# Patient Record
Sex: Male | Born: 1970 | Race: Asian | Hispanic: No | Marital: Married | Smoking: Never smoker
Health system: Southern US, Community
[De-identification: ages and names within clinical notes are randomized; demographics above are authoritative.]

## PROBLEM LIST (undated history)

## (undated) DIAGNOSIS — T7840XA Allergy, unspecified, initial encounter: Secondary | ICD-10-CM

## (undated) DIAGNOSIS — B1910 Unspecified viral hepatitis B without hepatic coma: Secondary | ICD-10-CM

## (undated) HISTORY — DX: Allergy, unspecified, initial encounter: T78.40XA

## (undated) HISTORY — DX: Unspecified viral hepatitis B without hepatic coma: B19.10

---

## 2019-03-07 ENCOUNTER — Emergency Department (HOSPITAL_BASED_OUTPATIENT_CLINIC_OR_DEPARTMENT_OTHER)
Admission: EM | Admit: 2019-03-07 | Discharge: 2019-03-07 | Disposition: A | Discharge status: Home / Self Care | Payer: Commercial Managed Care - PPO | Attending: Emergency Medicine | Admitting: Emergency Medicine

## 2019-03-07 ENCOUNTER — Other Ambulatory Visit: Payer: Self-pay

## 2019-03-07 ENCOUNTER — Emergency Department (HOSPITAL_BASED_OUTPATIENT_CLINIC_OR_DEPARTMENT_OTHER): Payer: Commercial Managed Care - PPO

## 2019-03-07 ENCOUNTER — Encounter (HOSPITAL_BASED_OUTPATIENT_CLINIC_OR_DEPARTMENT_OTHER): Payer: Self-pay

## 2019-03-07 DIAGNOSIS — R42 Dizziness and giddiness: Secondary | ICD-10-CM | POA: Diagnosis not present

## 2019-03-07 LAB — CBC WITH DIFFERENTIAL/PLATELET
Abs Immature Granulocytes: 0.01 10*3/uL (ref 0.00–0.07)
Basophils Absolute: 0 10*3/uL (ref 0.0–0.1)
Basophils Relative: 0 %
Eosinophils Absolute: 0 10*3/uL (ref 0.0–0.5)
Eosinophils Relative: 0 %
HCT: 50 % (ref 39.0–52.0)
Hemoglobin: 16.3 g/dL (ref 13.0–17.0)
Immature Granulocytes: 0 %
Lymphocytes Relative: 31 %
Lymphs Abs: 1.1 10*3/uL (ref 0.7–4.0)
MCH: 26.5 pg (ref 26.0–34.0)
MCHC: 32.6 g/dL (ref 30.0–36.0)
MCV: 81.4 fL (ref 80.0–100.0)
Monocytes Absolute: 0.5 10*3/uL (ref 0.1–1.0)
Monocytes Relative: 13 %
Neutro Abs: 1.9 10*3/uL (ref 1.7–7.7)
Neutrophils Relative %: 56 %
Platelets: 170 10*3/uL (ref 150–400)
RBC: 6.14 MIL/uL — ABNORMAL HIGH (ref 4.22–5.81)
RDW: 12.8 % (ref 11.5–15.5)
WBC: 3.5 10*3/uL — ABNORMAL LOW (ref 4.0–10.5)
nRBC: 0 % (ref 0.0–0.2)

## 2019-03-07 LAB — BASIC METABOLIC PANEL
Anion gap: 8 (ref 5–15)
BUN: 20 mg/dL (ref 6–20)
CO2: 22 mmol/L (ref 22–32)
Calcium: 8.3 mg/dL — ABNORMAL LOW (ref 8.9–10.3)
Chloride: 108 mmol/L (ref 98–111)
Creatinine, Ser: 0.95 mg/dL (ref 0.61–1.24)
GFR calc Af Amer: >60 mL/min (ref >60)
GFR calc non Af Amer: >60 mL/min (ref >60)
Glucose, Bld: 118 mg/dL — ABNORMAL HIGH (ref 70–99)
Potassium: 3.8 mmol/L (ref 3.5–5.1)
Sodium: 138 mmol/L (ref 135–145)

## 2019-03-07 LAB — CBG MONITORING, ED: Glucose-Capillary: 123 mg/dL — ABNORMAL HIGH (ref 70–99)

## 2019-03-07 MED ORDER — SODIUM CHLORIDE 0.9 % IV BOLUS
500.0000 mL | Freq: Once | INTRAVENOUS | Status: AC
  Administered 2019-03-07: 500 mL via INTRAVENOUS

## 2019-03-07 NOTE — Discharge Instructions (Signed)
Make sure to drink plenty of water, at least 64 ounces daily.  Please follow-up and establish care with a primary care provider by calling the number circled on your discharge paperwork.  Please return to the emergency department if you develop any new or worsening symptoms including completely passing out, not able to walk in a straight line without falling, persistent dizziness, or any other new or concerning symptoms.

## 2019-03-07 NOTE — ED Notes (Signed)
Pt laying flat for orthostatics  

## 2019-03-07 NOTE — ED Triage Notes (Signed)
Pt presents with HA x 1 week with nausea. Denies dizziness/blurred vision. Pt took 2 tylenol at 8am.

## 2019-03-07 NOTE — ED Notes (Signed)
ED Provider at bedside. 

## 2019-03-07 NOTE — ED Provider Notes (Signed)
[facility] EMERGENCY DEPARTMENT Provider Note   CSN: [id number] Arrival date & time: 03/07/19  1408    History   Chief Complaint Chief Complaint  Patient presents with   Dizziness    HPI Cesar Reyes is a 48 y.o. male who presents with a one-week history of feeling off balance when he wakes up.  He has had some associated nausea, but no vomiting.  He denies any headache, vision changes.  He describes his symptoms like feeling unsteady when he walks in the morning, however gets better over the course of the day.  He reports he has felt cold this week, however denies any documented fever.  He has had a decreased appetite.  He does not know if he has lost any weight.  He denies any chest pain, shortness of breath, abdominal pain, vomiting, diarrhea, urinary symptoms.  Patient has taken Tylenol a few times this week which is helped his symptoms of feeling cold.     The history is limited by a language barrier. A language interpreter was used (patient's daughter filled in gaps, as patient does speak english).  Dizziness  Quality:  Imbalance Associated symptoms: nausea   Associated symptoms: no chest pain, no diarrhea, no headaches, no shortness of breath and no vomiting     History reviewed. No pertinent past medical history.  There are no active problems to display for this patient.   History reviewed. No pertinent surgical history.      Home Medications    Prior to Admission medications   Not on File    Family History No family history on file.  Social History Social History   Tobacco Use   Smoking status: Never Smoker   Smokeless tobacco: Never Used  Substance Use Topics   Alcohol use: Never    Frequency: Never   Drug use: Never     Allergies   Patient has no allergy information on record.   Review of Systems Review of Systems  Constitutional: Positive for appetite change and chills. Negative for fever.  HENT: Negative for facial swelling  and sore throat.   Respiratory: Negative for shortness of breath.   Cardiovascular: Negative for chest pain.  Gastrointestinal: Positive for nausea. Negative for abdominal pain, diarrhea and vomiting.  Genitourinary: Negative for dysuria.  Musculoskeletal: Negative for back pain.  Skin: Negative for rash and wound.  Neurological: Positive for dizziness (feeling unsteady). Negative for seizures, syncope and headaches.  Psychiatric/Behavioral: The patient is not nervous/anxious.      Physical Exam Updated Vital Signs BP 128/86 (BP Location: Right Arm)    Pulse 78    Temp 97.7 F (36.5 C) (Oral)    Resp 17    Ht 5\' 4"  (1.626 m)    SpO2 99%   Physical Exam Vitals signs and nursing note reviewed.  Constitutional:      General: He is not in acute distress.    Appearance: He is well-developed. He is not diaphoretic.  HENT:     Head: Normocephalic and atraumatic.     Right Ear: Tympanic membrane normal. No middle ear effusion. Tympanic membrane is not erythematous.     Left Ear: Tympanic membrane normal.  No middle ear effusion. Tympanic membrane is not erythematous.     Mouth/Throat:     Pharynx: No oropharyngeal exudate.  Eyes:     General: No scleral icterus.       Right eye: No discharge.        Left eye: No discharge.  Conjunctiva/sclera: Conjunctivae normal.     Pupils: Pupils are equal, round, and reactive to light.  Neck:     Musculoskeletal: Normal range of motion and neck supple.     Thyroid: No thyromegaly.  Cardiovascular:     Rate and Rhythm: Normal rate and regular rhythm.     Heart sounds: Normal heart sounds. No murmur. No friction rub. No gallop.   Pulmonary:     Effort: Pulmonary effort is normal. No respiratory distress.     Breath sounds: Normal breath sounds. No stridor. No wheezing or rales.  Abdominal:     General: Bowel sounds are normal. There is no distension.     Palpations: Abdomen is soft.     Tenderness: There is no abdominal tenderness. There is  no guarding or rebound.  Lymphadenopathy:     Cervical: No cervical adenopathy.  Skin:    General: Skin is warm and dry.     Coloration: Skin is not pale.     Findings: No rash.  Neurological:     Mental Status: He is alert.     Coordination: Coordination normal.     Comments: CN 3-12 intact; normal sensation throughout; 5/5 strength in all 4 extremities; equal bilateral grip strength; no ataxia on finger-to-nose      ED Treatments / Results  Labs (all labs ordered are listed, but only abnormal results are displayed) Labs Reviewed  BASIC METABOLIC PANEL - Abnormal; Notable for the following components:      Result Value   Glucose, Bld 118 (*)    Calcium 8.3 (*)    All other components within normal limits  CBC WITH DIFFERENTIAL/PLATELET - Abnormal; Notable for the following components:   WBC 3.5 (*)    RBC 6.14 (*)    All other components within normal limits  CBG MONITORING, ED - Abnormal; Notable for the following components:   Glucose-Capillary 123 (*)    All other components within normal limits    EKG EKG Interpretation  Date/Time:  Friday Mar 07 2019 14:39:24 EDT Ventricular Rate:  76 PR Interval:    QRS Duration: 97 QT Interval:  383 QTC Calculation: 431 R Axis:   42 Text Interpretation:  Sinus rhythm Abnormal R-wave progression, early transition Slight PR depression Confirmed by Alvira Monday ([id number]) on 03/07/2019 2:57:10 PM   Radiology Ct Head Wo Contrast  Result Date: 03/07/2019 CLINICAL DATA:  Dizziness, lightheadedness, headache EXAM: CT HEAD WITHOUT CONTRAST TECHNIQUE: Contiguous axial images were obtained from the base of the skull through the vertex without intravenous contrast. COMPARISON:  None. FINDINGS: Brain: No evidence of acute infarction, hemorrhage, hydrocephalus, extra-axial collection or mass lesion/mass effect. Vascular: No hyperdense vessel or unexpected calcification. Skull: Normal. Negative for fracture or focal lesion. Sinuses/Orbits: No  acute finding. Other: None. IMPRESSION: No acute intracranial pathology. No non-contrast CT findings to explain dizziness, lightheadedness, or headache. Electronically Signed   By: Lauralyn Primes M.D.   On: 03/07/2019 15:34    Procedures Procedures (including critical care time)  Medications Ordered in ED Medications  sodium chloride 0.9 % bolus 500 mL (0 mLs Intravenous Stopped 03/07/19 1514)     Initial Impression / Assessment and Plan / ED Course  I have reviewed the triage vital signs and the nursing notes.  Pertinent labs & imaging results that were available during my care of the patient were reviewed by me and considered in my medical decision making (see chart for details).        Patient presenting with  a one-week history of lightheadedness, mostly in the morning.  He is also had some chills and decreased appetite.  Patient was mildly orthostatic with drop from 130/90 to 120/90.  His labs are unremarkable except for some mild leukopenia with WBC at 3.5.  CT head is negative for acute findings.  Patient walks with a normal gait.  He is given 500 mL fluid bolus and is feeling much improved.  We walked around the department without any dizziness or lightheadedness.  Patient advised to increase the amount of water he is drinking daily.  Also consider of a viral syndrome, however patient has no other associated respiratory or GI symptoms.  Follow-up and establish care with a PCP.  Patient given resources.  Patient understands reasons to return.  Patient vitals stable throughout ED course and discharged in satisfactory condition. I discussed patient case with Dr. Dalene Seltzer who guided the patient's management and agrees with plan.   Final Clinical Impressions(s) / ED Diagnoses   Final diagnoses:  Lightheadedness    ED Discharge Orders    None       Emi Holes, PA-C 03/07/19 1548    Alvira Monday, MD 03/08/19 [phone]

## 2019-03-07 NOTE — ED Notes (Signed)
Patient transported to CT 

## 2020-10-29 IMAGING — CT CT HEAD WITHOUT CONTRAST
3 series · 16 of 47 positions shown, 19 images · non-contrast
Comparison: None.

CLINICAL DATA: Dizziness, lightheadedness, headache

EXAM:
CT HEAD WITHOUT CONTRAST
TECHNIQUE: Contiguous axial images were obtained from the base of the skull
through the vertex without intravenous contrast.

[Series 2: head wo · axial · 0.45mm/px · z∈[-148,-18]mm · 10 of 32 slices shown, 13 images]
[im 3/32  brain]
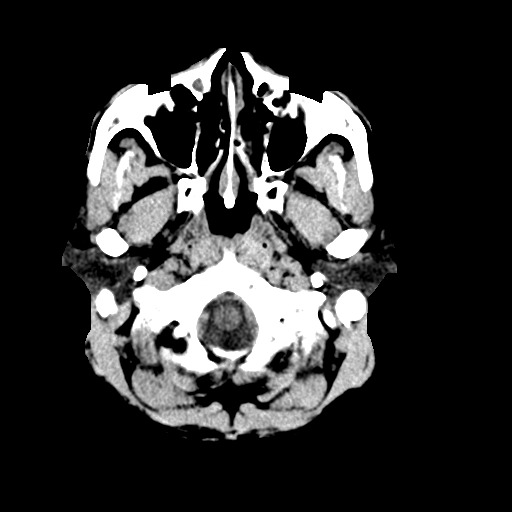
[im 3/32  bone]
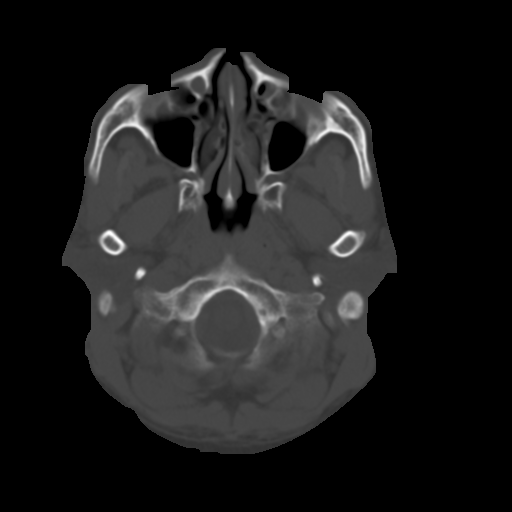
[im 6/32  brain]
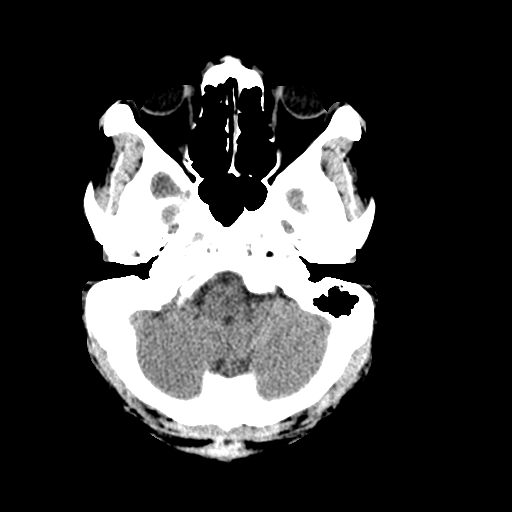
[im 9/32  brain]
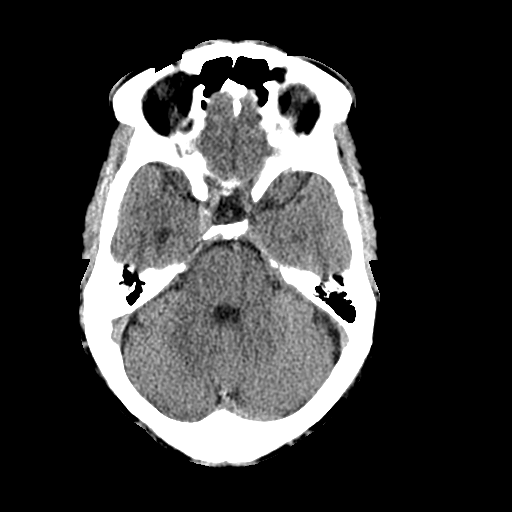
[im 11/32  brain]
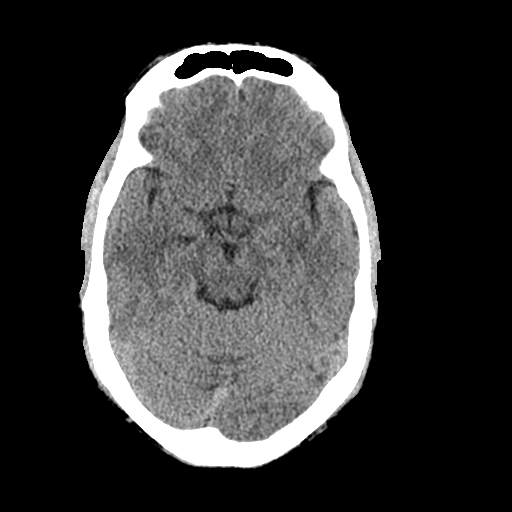
[im 14/32  brain]
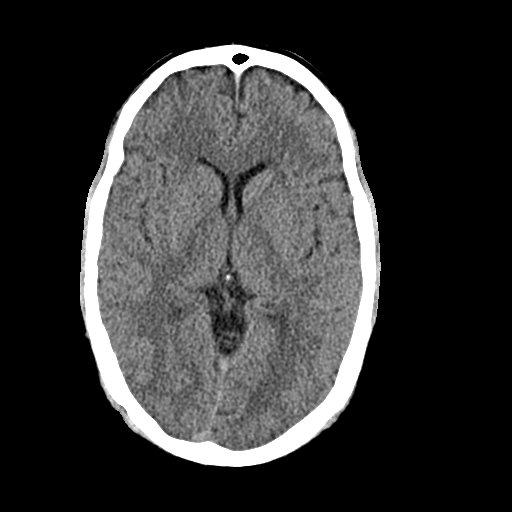
[im 14/32  bone]
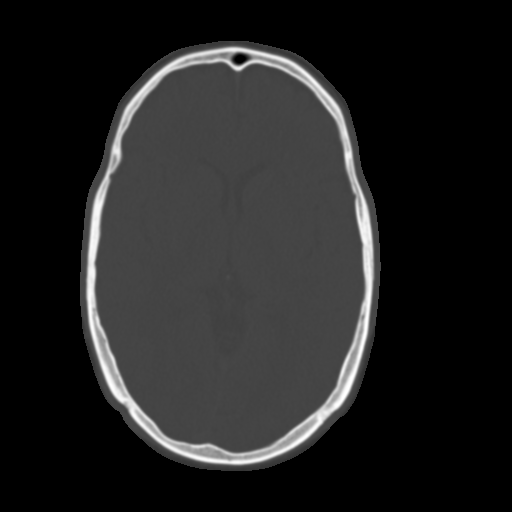
[im 18/32  brain]
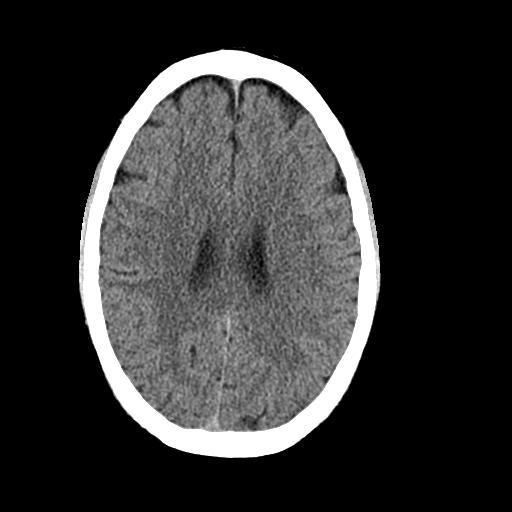
[im 21/32  brain]
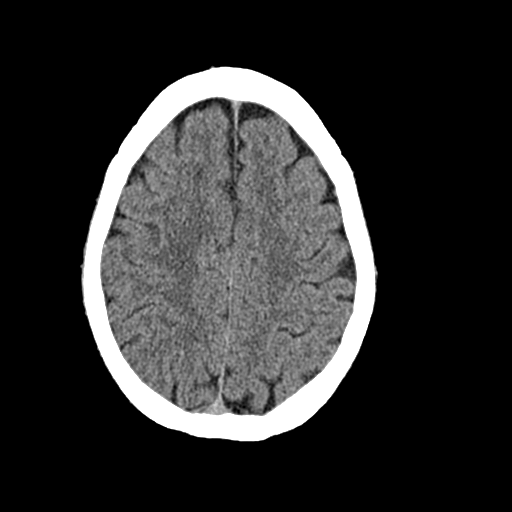
[im 24/32  brain]
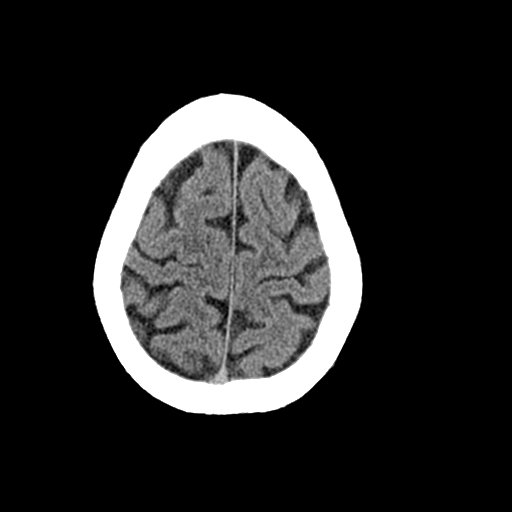
[im 26/32  brain]
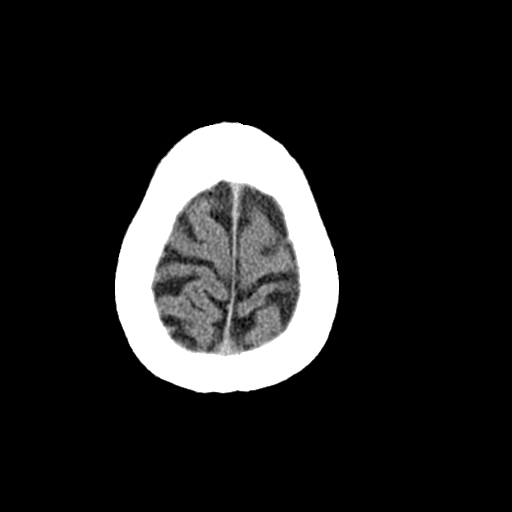
[im 26/32  bone]
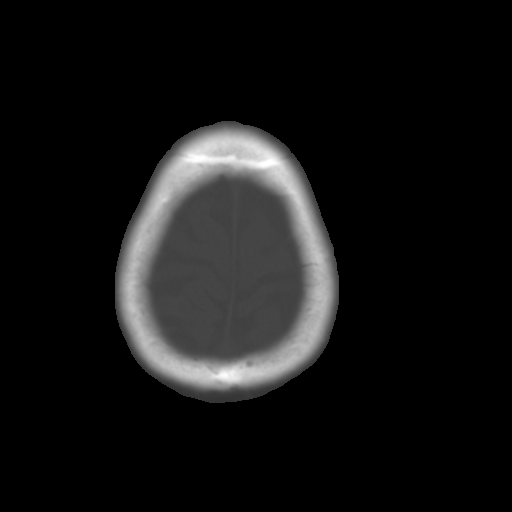
[im 29/32  brain]
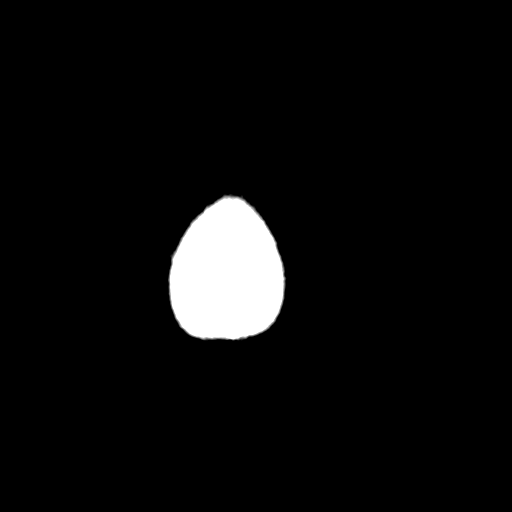

[Series 4: cor soft · coronal · 0.33mm/px · 3 of 71 slices shown]
[im 24/71  brain]
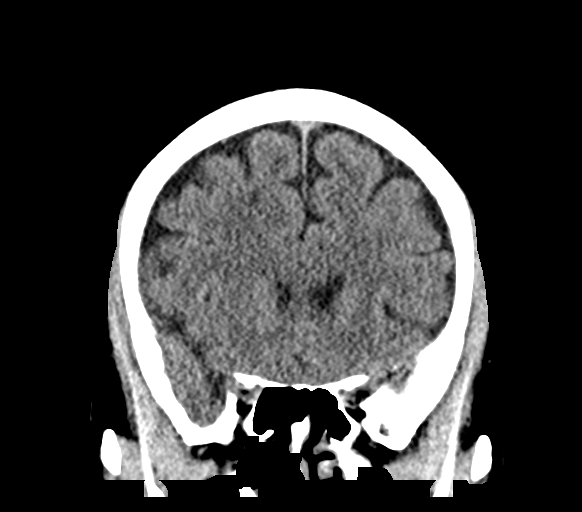
[im 32/71  brain]
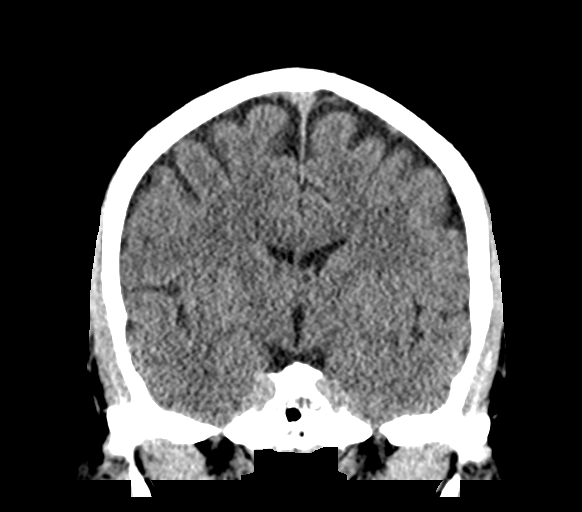
[im 39/71  brain]
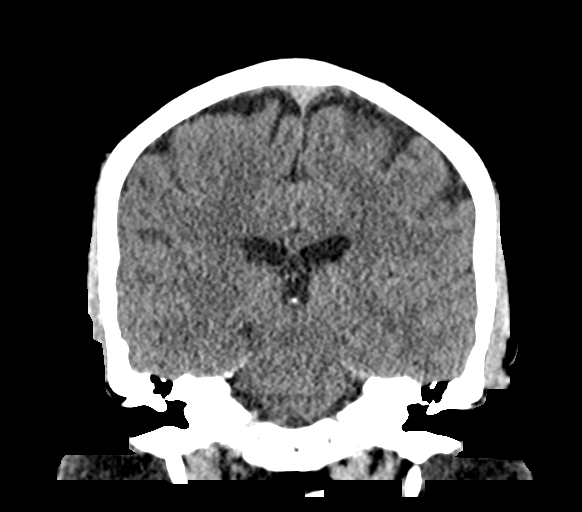

[Series 5: sag soft · sagittal · 0.33mm/px · 3 of 58 slices shown]
[im 20/58  brain]
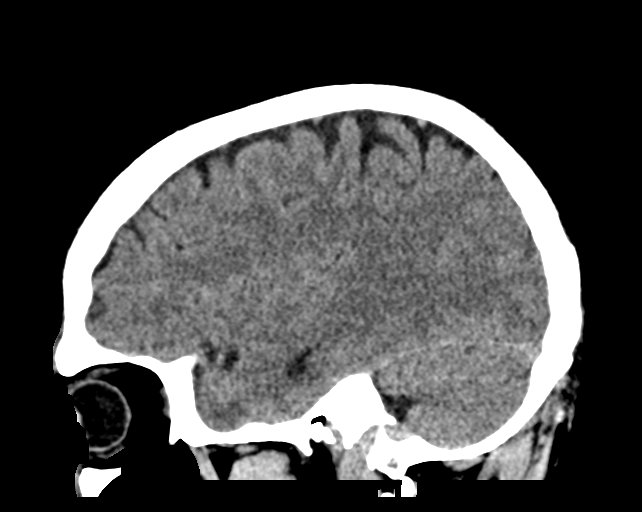
[im 29/58  brain]
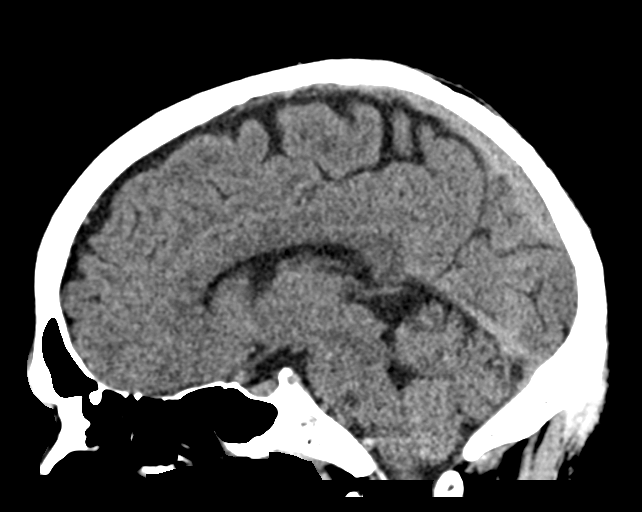
[im 39/58  brain]
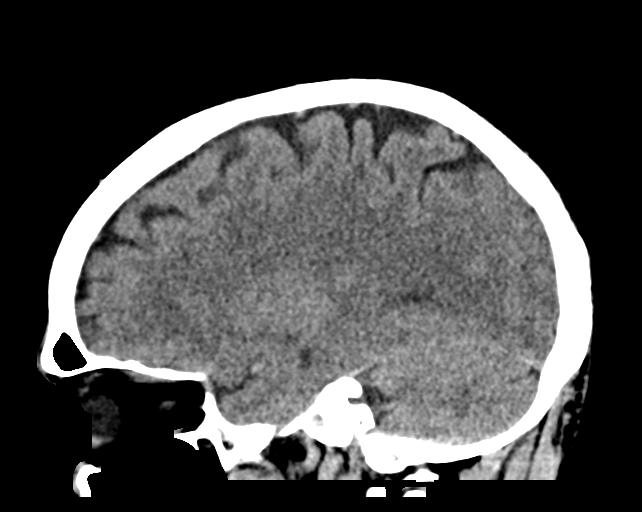

[16 of 47 positions shown; findings below may reference images not displayed]

FINDINGS: Brain: No evidence of acute infarction, hemorrhage, hydrocephalus,
extra-axial collection or mass lesion/mass effect.

Vascular: No hyperdense vessel or unexpected calcification.

Skull: Normal. Negative for fracture or focal lesion.

Sinuses/Orbits: No acute finding.

Other: None.
IMPRESSION: No acute intracranial pathology. No non-contrast CT findings to
explain dizziness, lightheadedness, or headache.

## 2023-07-23 ENCOUNTER — Emergency Department (HOSPITAL_BASED_OUTPATIENT_CLINIC_OR_DEPARTMENT_OTHER): Payer: PRIVATE HEALTH INSURANCE

## 2023-07-23 ENCOUNTER — Inpatient Hospital Stay (HOSPITAL_BASED_OUTPATIENT_CLINIC_OR_DEPARTMENT_OTHER)
Admission: EM | Admit: 2023-07-23 | Discharge: 2023-07-27 | DRG: 442 | Disposition: A | Discharge status: Home / Self Care | Payer: PRIVATE HEALTH INSURANCE | Attending: Internal Medicine | Admitting: Internal Medicine

## 2023-07-23 ENCOUNTER — Other Ambulatory Visit: Payer: Self-pay

## 2023-07-23 DIAGNOSIS — K7200 Acute and subacute hepatic failure without coma: Principal | ICD-10-CM | POA: Diagnosis present

## 2023-07-23 DIAGNOSIS — R109 Unspecified abdominal pain: Secondary | ICD-10-CM

## 2023-07-23 DIAGNOSIS — Z8249 Family history of ischemic heart disease and other diseases of the circulatory system: Secondary | ICD-10-CM

## 2023-07-23 DIAGNOSIS — R1084 Generalized abdominal pain: Secondary | ICD-10-CM | POA: Diagnosis present

## 2023-07-23 DIAGNOSIS — N400 Enlarged prostate without lower urinary tract symptoms: Secondary | ICD-10-CM | POA: Diagnosis present

## 2023-07-23 DIAGNOSIS — R7989 Other specified abnormal findings of blood chemistry: Secondary | ICD-10-CM

## 2023-07-23 DIAGNOSIS — R17 Unspecified jaundice: Secondary | ICD-10-CM

## 2023-07-23 DIAGNOSIS — I10 Essential (primary) hypertension: Secondary | ICD-10-CM | POA: Diagnosis present

## 2023-07-23 DIAGNOSIS — B181 Chronic viral hepatitis B without delta-agent: Secondary | ICD-10-CM | POA: Diagnosis present

## 2023-07-23 DIAGNOSIS — R7401 Elevation of levels of liver transaminase levels: Principal | ICD-10-CM

## 2023-07-23 LAB — URINALYSIS, ROUTINE W REFLEX MICROSCOPIC
Bacteria, UA: NONE SEEN
Glucose, UA: NEGATIVE mg/dL
Hgb urine dipstick: NEGATIVE
Ketones, ur: NEGATIVE mg/dL
Leukocytes,Ua: NEGATIVE
Nitrite: NEGATIVE
Protein, ur: NEGATIVE mg/dL
Specific Gravity, Urine: 1.018 (ref 1.005–1.030)
pH: 6 (ref 5.0–8.0)

## 2023-07-23 LAB — CBC
HCT: 39.4 % (ref 39.0–52.0)
Hemoglobin: 13.7 g/dL (ref 13.0–17.0)
MCH: 28.7 pg (ref 26.0–34.0)
MCHC: 34.8 g/dL (ref 30.0–36.0)
MCV: 82.6 fL (ref 80.0–100.0)
Platelets: 178 10*3/uL (ref 150–400)
RBC: 4.77 MIL/uL (ref 4.22–5.81)
RDW: 16.4 % — ABNORMAL HIGH (ref 11.5–15.5)
WBC: 4.9 10*3/uL (ref 4.0–10.5)
nRBC: 0 % (ref 0.0–0.2)

## 2023-07-23 LAB — COMPREHENSIVE METABOLIC PANEL
ALT: 1826 U/L — ABNORMAL HIGH (ref 0–44)
AST: 649 U/L — ABNORMAL HIGH (ref 15–41)
Albumin: 3.5 g/dL (ref 3.5–5.0)
Alkaline Phosphatase: 70 U/L (ref 38–126)
Anion gap: 8 (ref 5–15)
BUN: 13 mg/dL (ref 6–20)
CO2: 24 mmol/L (ref 22–32)
Calcium: 8.5 mg/dL — ABNORMAL LOW (ref 8.9–10.3)
Chloride: 105 mmol/L (ref 98–111)
Creatinine, Ser: 0.85 mg/dL (ref 0.61–1.24)
GFR, Estimated: >60 mL/min (ref >60)
Glucose, Bld: 108 mg/dL — ABNORMAL HIGH (ref 70–99)
Potassium: 3.7 mmol/L (ref 3.5–5.1)
Sodium: 137 mmol/L (ref 135–145)
Total Bilirubin: 6.3 mg/dL — ABNORMAL HIGH (ref 0.3–1.2)
Total Protein: 7.1 g/dL (ref 6.5–8.1)

## 2023-07-23 LAB — LIPASE, BLOOD: Lipase: 27 U/L (ref 11–51)

## 2023-07-23 MED ORDER — IOHEXOL 300 MG/ML  SOLN
100.0000 mL | Freq: Once | INTRAMUSCULAR | Status: AC | PRN
  Administered 2023-07-23: 80 mL via INTRAVENOUS

## 2023-07-23 NOTE — ED Provider Notes (Incomplete)
[place] EMERGENCY DEPARTMENT AT [facility] Provider Note   CSN: [id number] Arrival date & time: 07/23/23  1823     History Chief Complaint  Patient presents with  . Abdominal Pain    Cesar Reyes is a 52 y.o. male.  Patient presents to the emergency department concerns of abdominal pain.  Patient sent here from urgent care with concerns of patient's eyes and skin somewhat yellowed as well as some blood in urine.  Patient's daughter reports that he has been having symptoms for approximately 5 days or so with some abdominal pain, nausea, vomiting.  Denies any bloody stools or hematemesis.  Does endorse that stools become a little more pale recently.  Some history of alcohol use but no heavy alcohol drinking.  Patient is from [place] and daughter is unsure patient is up-to-date on his immunizations.  No recent contact with anyone with similar symptoms.  No prior history of hepatitis.  No recent weight loss, fevers, night sweats, or prior history of malignancy.   Abdominal Pain      Home Medications Prior to Admission medications   Not on File      Allergies    Patient has no known allergies.    Review of Systems   Review of Systems  Gastrointestinal:  Positive for abdominal pain.  All other systems reviewed and are negative.   Physical Exam Updated Vital Signs BP 128/88   Pulse 67   Temp 98.4 F (36.9 C) (Oral)   Resp 17   Ht 5\' 4"  (1.626 m)   Wt 78.5 kg   SpO2 98%   BMI 29.70 kg/m  Physical Exam Vitals and nursing note reviewed.  Constitutional:      General: He is not in acute distress.    Appearance: He is well-developed.  HENT:     Head: Normocephalic and atraumatic.  Eyes:     General: Scleral icterus present.     Conjunctiva/sclera: Conjunctivae normal.  Cardiovascular:     Rate and Rhythm: Normal rate and regular rhythm.     Heart sounds: No murmur heard. Pulmonary:     Effort: Pulmonary effort is normal. No respiratory distress.     Breath  sounds: Normal breath sounds.  Abdominal:     Palpations: Abdomen is soft.     Tenderness: There is abdominal tenderness in the right upper quadrant.     Comments: No obvious hepatomegaly and only mild right upper quadrant tenderness on deep palpation  Musculoskeletal:        General: No swelling.     Cervical back: Neck supple.  Skin:    General: Skin is warm and dry.     Capillary Refill: Capillary refill takes less than 2 seconds.     Comments: Skin slightly yellowed.   Neurological:     Mental Status: He is alert.  Psychiatric:        Mood and Affect: Mood normal.     ED Results / Procedures / Treatments   Labs (all labs ordered are listed, but only abnormal results are displayed) Labs Reviewed  COMPREHENSIVE METABOLIC PANEL - Abnormal; Notable for the following components:      Result Value   Glucose, Bld 108 (*)    Calcium 8.5 (*)    AST 649 (*)    ALT 1,826 (*)    Total Bilirubin 6.3 (*)    All other components within normal limits  CBC - Abnormal; Notable for the following components:   RDW 16.4 (*)  All other components within normal limits  URINALYSIS, ROUTINE W REFLEX MICROSCOPIC - Abnormal; Notable for the following components:   APPearance HAZY (*)    Bilirubin Urine SMALL (*)    All other components within normal limits  LIPASE, BLOOD  HEPATITIS PANEL, ACUTE    EKG None  Radiology No results found.  Procedures Procedures   Medications Ordered in ED Medications  iohexol (OMNIPAQUE) 300 MG/ML solution 100 mL (80 mLs Intravenous Contrast Given 07/23/23 2200)    ED Course/ Medical Decision Making/ A&P                               Medical Decision Making Amount and/or Complexity of Data Reviewed Labs: ordered. Radiology: ordered.  Risk Prescription drug management.   This patient presents to the ED for concern of abdominal pain.  Differential diagnosis includes diverticulitis, bowel obstruction, hepatitis, pancreatitis,  malignancy   Lab Tests:  I Ordered, and personally interpreted labs.  The pertinent results include: CBC unremarkable, CMP with severe elevations in AST and ALT at 649 and 1026 respectively, total bilirubin at 6.3, UA with some small bilirubin but no blood noted, lipase unremarkable, hepatitis panel pending   Imaging Studies ordered:  I ordered imaging studies including CT abdomen pelvis I independently visualized and interpreted imaging which showed *** I agree with the radiologist interpretation   Medicines ordered and prescription drug management:  I ordered medication including ***  for ***  Reevaluation of the patient after these medicines showed that the patient {resolved/improved/worsened:23923::"improved"} I have reviewed the patients home medicines and have made adjustments as needed   Problem List / ED Course:  Patient presents to the emergency department without significant past medical history here for abdominal pain.  Patient seen at urgent care and advised to come to the emergency department out of concerns of patient's yellowing skin and eyes.  Patient has had some right upper quadrant pain with nausea and vomiting for the last 5 days or so.  Denies any fevers or weakness.  No prior history of hepatitis but patient's daughter reports that they are unsure if patient is up-to-date on any hepatitis immunizations.  No recent contact with any individuals who are sick.  No IV drug use.  No heavy alcohol use. Patient's labs concerning with AST at 649, ALT at 1826, and total bilirubin at 6.3.  When compared to CMP from 03/19/2023, ALT and AST as well as bilirubin are unremarkable at that time.  Patient appears to have had a significant elevation in his levels over the last 4 months or so.  However suspect that this is likely acute as symptoms only began about 5 days ago.   Social Determinants of Health:    Final Clinical Impression(s) / ED Diagnoses Final diagnoses:  None     Rx / DC Orders ED Discharge Orders     None

## 2023-07-23 NOTE — ED Triage Notes (Signed)
Daughter at bedside provides hx; reports they were sent by UC due to concern yellowing in his eyes and blood in urine. When asked if there's any pain, reports problem in stomach. Daughter stated  prior hx of problems with liver back when he was in [place].

## 2023-07-23 NOTE — ED Provider Notes (Signed)
[place] EMERGENCY DEPARTMENT AT [facility] Provider Note   CSN: [id number] Arrival date & time: 07/23/23  1823     History Chief Complaint  Patient presents with   Abdominal Pain    Cesar Reyes is a 52 y.o. male.  Patient presents to the emergency department concerns of abdominal pain.  Patient sent here from urgent care with concerns of patient's eyes and skin somewhat yellowed as well as some blood in urine.  Patient's daughter reports that he has been having symptoms for approximately 5 days or so with some abdominal pain, nausea, vomiting.  Denies any bloody stools or hematemesis.  Does endorse that stools become a little more pale recently.  Some history of alcohol use but no heavy alcohol drinking.  Patient is from [place] and daughter is unsure patient is up-to-date on his immunizations.  No recent contact with anyone with similar symptoms.  No prior history of hepatitis.  No recent weight loss, fevers, night sweats, or prior history of malignancy.   Abdominal Pain      Home Medications Prior to Admission medications   Not on File      Allergies    Patient has no known allergies.    Review of Systems   Review of Systems  Gastrointestinal:  Positive for abdominal pain.  All other systems reviewed and are negative.   Physical Exam Updated Vital Signs BP 128/88   Pulse 67   Temp 98.4 F (36.9 C) (Oral)   Resp 17   Ht 5\' 4"  (1.626 m)   Wt 78.5 kg   SpO2 98%   BMI 29.70 kg/m  Physical Exam Vitals and nursing note reviewed.  Constitutional:      General: He is not in acute distress.    Appearance: He is well-developed.  HENT:     Head: Normocephalic and atraumatic.  Eyes:     General: Scleral icterus present.     Conjunctiva/sclera: Conjunctivae normal.  Cardiovascular:     Rate and Rhythm: Normal rate and regular rhythm.     Heart sounds: No murmur heard. Pulmonary:     Effort: Pulmonary effort is normal. No respiratory distress.     Breath  sounds: Normal breath sounds.  Abdominal:     Palpations: Abdomen is soft.     Tenderness: There is abdominal tenderness in the right upper quadrant.     Comments: No obvious hepatomegaly and only mild right upper quadrant tenderness on deep palpation  Musculoskeletal:        General: No swelling.     Cervical back: Neck supple.  Skin:    General: Skin is warm and dry.     Capillary Refill: Capillary refill takes less than 2 seconds.     Comments: Skin slightly yellowed.   Neurological:     Mental Status: He is alert.  Psychiatric:        Mood and Affect: Mood normal.     ED Results / Procedures / Treatments   Labs (all labs ordered are listed, but only abnormal results are displayed) Labs Reviewed  COMPREHENSIVE METABOLIC PANEL - Abnormal; Notable for the following components:      Result Value   Glucose, Bld 108 (*)    Calcium 8.5 (*)    AST 649 (*)    ALT 1,826 (*)    Total Bilirubin 6.3 (*)    All other components within normal limits  CBC - Abnormal; Notable for the following components:   RDW 16.4 (*)  All other components within normal limits  URINALYSIS, ROUTINE W REFLEX MICROSCOPIC - Abnormal; Notable for the following components:   APPearance HAZY (*)    Bilirubin Urine SMALL (*)    All other components within normal limits  LIPASE, BLOOD  HEPATITIS PANEL, ACUTE    EKG None  Radiology CT ABDOMEN PELVIS W CONTRAST  Result Date: 07/23/2023 CLINICAL DATA:  Jaundiced with hematuria. EXAM: CT ABDOMEN AND PELVIS WITH CONTRAST TECHNIQUE: Multidetector CT imaging of the abdomen and pelvis was performed using the standard protocol following bolus administration of intravenous contrast. RADIATION DOSE REDUCTION: This exam was performed according to the departmental dose-optimization program which includes automated exposure control, adjustment of the mA and/or kV according to patient size and/or use of iterative reconstruction technique. CONTRAST:  80mL OMNIPAQUE  IOHEXOL 300 MG/ML  SOLN COMPARISON:  August 11, 2020 FINDINGS: Lower chest: No acute abnormality. Hepatobiliary: No focal liver abnormality is seen. The gallbladder is contracted without evidence of gallstones, gallbladder wall thickening, or biliary dilatation. Pancreas: Unremarkable. No pancreatic ductal dilatation or surrounding inflammatory changes. Spleen: Normal in size without focal abnormality. Adrenals/Urinary Tract: Adrenal glands are unremarkable. Kidneys are normal, without renal calculi, focal lesion, or hydronephrosis. Bladder is unremarkable. Stomach/Bowel: Stomach is within normal limits. Appendix appears normal. No evidence of bowel wall thickening, distention, or inflammatory changes. Vascular/Lymphatic: No significant vascular findings are present. No enlarged abdominal or pelvic lymph nodes. Reproductive: The prostate gland is mildly enlarged. Other: No abdominal wall hernia or abnormality. No abdominopelvic ascites. Musculoskeletal: No acute or significant osseous findings. IMPRESSION: 1. No acute or active process within the abdomen or pelvis. 2. Mild prostatomegaly. Electronically Signed   By: Aram Candela M.D.   On: 07/23/2023 23:49    Procedures Procedures   Medications Ordered in ED Medications  iohexol (OMNIPAQUE) 300 MG/ML solution 100 mL (80 mLs Intravenous Contrast Given 07/23/23 2200)    ED Course/ Medical Decision Making/ A&P                               Medical Decision Making Amount and/or Complexity of Data Reviewed Labs: ordered. Radiology: ordered.  Risk Prescription drug management. Decision regarding hospitalization.   This patient presents to the ED for concern of abdominal pain.  Differential diagnosis includes diverticulitis, bowel obstruction, hepatitis, pancreatitis, malignancy   Lab Tests:  I Ordered, and personally interpreted labs.  The pertinent results include: CBC unremarkable, CMP with severe elevations in AST and ALT at 649 and  1026 respectively, total bilirubin at 6.3, UA with some small bilirubin but no blood noted, lipase unremarkable, hepatitis panel pending   Imaging Studies ordered:  I ordered imaging studies including CT abdomen pelvis I independently visualized and interpreted imaging which showed no acute process noted in abdomen, mild prostatomegaly I agree with the radiologist interpretation   Problem List / ED Course:  Patient presents to the emergency department without significant past medical history here for abdominal pain.  Patient seen at urgent care and advised to come to the emergency department out of concerns of patient's yellowing skin and eyes.  Patient has had some right upper quadrant pain with nausea and vomiting for the last 5 days or so.  Denies any fevers or weakness.  No prior history of hepatitis but patient's daughter reports that they are unsure if patient is up-to-date on any hepatitis immunizations.  No recent contact with any individuals who are sick.  No IV drug use.  No heavy alcohol use. Patient's labs concerning with AST at 649, ALT at 1826, and total bilirubin at 6.3.  When compared to CMP from 03/19/2023, ALT and AST as well as bilirubin are unremarkable at that time.  Patient appears to have had a significant elevation in his levels over the last 4 months or so.  However suspect that this is likely acute as symptoms only began about 5 days ago. CT imaging unremarkable. Some prostatomegaly otherwise unremarkable.  No GI. Spoke with Dr. Loreta Ave, GI, who advised admission out of concern for significant elevation in liver enzymes as well as total bilirubin up to 6.3.  Does not feel comfortable having this patient follow-up outpatient given the significant elevation and no known reason why this is elevated. Will consult hospitalist for admission. Spoke with Dr. Margo Aye, hospitalist, who will be admitting patient for observation and GI follow up.  Final Clinical Impression(s) / ED  Diagnoses Final diagnoses:  Transaminitis  Hyperbilirubinemia    Rx / DC Orders ED Discharge Orders     None         Smitty Knudsen, PA-C 07/24/23 0031    Royanne Foots, DO 07/31/23 1350

## 2023-07-24 ENCOUNTER — Encounter (HOSPITAL_COMMUNITY): Payer: Self-pay | Admitting: Internal Medicine

## 2023-07-24 DIAGNOSIS — R7989 Other specified abnormal findings of blood chemistry: Secondary | ICD-10-CM | POA: Diagnosis not present

## 2023-07-24 DIAGNOSIS — R7401 Elevation of levels of liver transaminase levels: Secondary | ICD-10-CM | POA: Diagnosis not present

## 2023-07-24 DIAGNOSIS — Z8249 Family history of ischemic heart disease and other diseases of the circulatory system: Secondary | ICD-10-CM | POA: Diagnosis not present

## 2023-07-24 DIAGNOSIS — R109 Unspecified abdominal pain: Secondary | ICD-10-CM

## 2023-07-24 DIAGNOSIS — N400 Enlarged prostate without lower urinary tract symptoms: Secondary | ICD-10-CM | POA: Diagnosis not present

## 2023-07-24 DIAGNOSIS — R1084 Generalized abdominal pain: Secondary | ICD-10-CM

## 2023-07-24 DIAGNOSIS — K7200 Acute and subacute hepatic failure without coma: Secondary | ICD-10-CM | POA: Diagnosis not present

## 2023-07-24 DIAGNOSIS — R17 Unspecified jaundice: Secondary | ICD-10-CM | POA: Diagnosis not present

## 2023-07-24 DIAGNOSIS — I10 Essential (primary) hypertension: Secondary | ICD-10-CM | POA: Diagnosis not present

## 2023-07-24 DIAGNOSIS — B181 Chronic viral hepatitis B without delta-agent: Secondary | ICD-10-CM | POA: Diagnosis not present

## 2023-07-24 LAB — COMPREHENSIVE METABOLIC PANEL
ALT: 1680 U/L — ABNORMAL HIGH (ref 0–44)
AST: 640 U/L — ABNORMAL HIGH (ref 15–41)
Albumin: 2.8 g/dL — ABNORMAL LOW (ref 3.5–5.0)
Alkaline Phosphatase: 67 U/L (ref 38–126)
Anion gap: 7 (ref 5–15)
BUN: 10 mg/dL (ref 6–20)
CO2: 23 mmol/L (ref 22–32)
Calcium: 8.7 mg/dL — ABNORMAL LOW (ref 8.9–10.3)
Chloride: 106 mmol/L (ref 98–111)
Creatinine, Ser: 0.9 mg/dL (ref 0.61–1.24)
GFR, Estimated: >60 mL/min (ref >60)
Glucose, Bld: 140 mg/dL — ABNORMAL HIGH (ref 70–99)
Potassium: 3.7 mmol/L (ref 3.5–5.1)
Sodium: 136 mmol/L (ref 135–145)
Total Bilirubin: 5.7 mg/dL — ABNORMAL HIGH (ref 0.3–1.2)
Total Protein: 6.8 g/dL (ref 6.5–8.1)

## 2023-07-24 LAB — CBC WITH DIFFERENTIAL/PLATELET
Abs Immature Granulocytes: 0.01 10*3/uL (ref 0.00–0.07)
Basophils Absolute: 0 10*3/uL (ref 0.0–0.1)
Basophils Relative: 0 %
Eosinophils Absolute: 0.1 10*3/uL (ref 0.0–0.5)
Eosinophils Relative: 1 %
HCT: 39 % (ref 39.0–52.0)
Hemoglobin: 13.5 g/dL (ref 13.0–17.0)
Immature Granulocytes: 0 %
Lymphocytes Relative: 33 %
Lymphs Abs: 1.2 10*3/uL (ref 0.7–4.0)
MCH: 29.5 pg (ref 26.0–34.0)
MCHC: 34.6 g/dL (ref 30.0–36.0)
MCV: 85.2 fL (ref 80.0–100.0)
Monocytes Absolute: 0.4 10*3/uL (ref 0.1–1.0)
Monocytes Relative: 10 %
Neutro Abs: 2.1 10*3/uL (ref 1.7–7.7)
Neutrophils Relative %: 56 %
Platelets: 159 10*3/uL (ref 150–400)
RBC: 4.58 MIL/uL (ref 4.22–5.81)
RDW: 16.4 % — ABNORMAL HIGH (ref 11.5–15.5)
WBC: 3.7 10*3/uL — ABNORMAL LOW (ref 4.0–10.5)
nRBC: 0 % (ref 0.0–0.2)

## 2023-07-24 LAB — MAGNESIUM: Magnesium: 1.9 mg/dL (ref 1.7–2.4)

## 2023-07-24 LAB — PROTIME-INR
INR: 1.3 — ABNORMAL HIGH (ref 0.8–1.2)
Prothrombin Time: 16.8 seconds — ABNORMAL HIGH (ref 11.4–15.2)

## 2023-07-24 LAB — ACETAMINOPHEN LEVEL: Acetaminophen (Tylenol), Serum: <10 ug/mL — ABNORMAL LOW (ref 10–30)

## 2023-07-24 LAB — ETHANOL: Alcohol, Ethyl (B): <10 mg/dL (ref <10)

## 2023-07-24 LAB — AMMONIA: Ammonia: 54 umol/L — ABNORMAL HIGH (ref 9–35)

## 2023-07-24 MED ORDER — SENNOSIDES-DOCUSATE SODIUM 8.6-50 MG PO TABS: 1.0000 | ORAL_TABLET | Freq: Every evening | ORAL | Status: DC | PRN

## 2023-07-24 MED ORDER — HEPARIN SODIUM (PORCINE) 5000 UNIT/ML IJ SOLN
5000.0000 [IU] | Freq: Three times a day (TID) | INTRAMUSCULAR | Status: DC
  Administered 2023-07-24 – 2023-07-27 (×10): 5000 [IU] via SUBCUTANEOUS
  Filled 2023-07-24 (×10): qty 1

## 2023-07-24 MED ORDER — ONDANSETRON HCL 4 MG/2ML IJ SOLN: 4.0000 mg | Freq: Four times a day (QID) | INTRAMUSCULAR | Status: DC | PRN

## 2023-07-24 MED ORDER — BOOST / RESOURCE BREEZE PO LIQD CUSTOM
1.0000 | Freq: Three times a day (TID) | ORAL | Status: DC
  Administered 2023-07-25 – 2023-07-27 (×8): 1 via ORAL

## 2023-07-24 MED ORDER — POTASSIUM CHLORIDE IN NACL 20-0.9 MEQ/L-% IV SOLN
INTRAVENOUS | Status: DC
  Filled 2023-07-24 (×3): qty 1000

## 2023-07-24 MED ORDER — HYDROMORPHONE HCL 1 MG/ML IJ SOLN: 0.5000 mg | INTRAMUSCULAR | Status: DC | PRN

## 2023-07-24 NOTE — ED Notes (Signed)
ED TO INPATIENT HANDOFF REPORT  ED Nurse Name and Phone #:   S Name/Age/Gender Cesar Reyes 52 y.o. male Room/Bed: DB003/DB003  Code Status   Code Status: Not on file  Home/SNF/Other Home Patient oriented to: self, place, time, and situation Is this baseline? Yes   Triage Complete: Triage complete  Chief Complaint Transaminitis [R74.01]  Triage Note Daughter at bedside provides hx; reports they were sent by UC due to concern yellowing in his eyes and blood in urine. When asked if there's any pain, reports problem in stomach. Daughter stated  prior hx of problems with liver back when he was in [place].    Allergies No Known Allergies  Level of Care/Admitting Diagnosis ED Disposition     ED Disposition  Admit   Condition  --   Comment  Hospital Area: [facility] [[id number]]  Level of Care: Med-Surg [16]  Interfacility transfer: Yes  May place patient in observation at [facility] or [facility] if equivalent level of care is available:: Yes  Covid Evaluation: Asymptomatic - no recent exposure (last 10 days) testing not required  Diagnosis: Transaminitis [638756]  Admitting Physician: Darlin Drop [[id number]]  Attending Physician: Darlin Drop [[id number]]          B Medical/Surgery History No past medical history on file. No past surgical history on file.   A IV Location/Drains/Wounds Patient Lines/Drains/Airways Status     Active Line/Drains/Airways     Name Placement date Placement time Site Days   Peripheral IV 07/23/23 20 G Left Antecubital 07/23/23  1841  Antecubital  1            Intake/Output Last 24 hours No intake or output data in the 24 hours ending 07/24/23 0036  Labs/Imaging Results for orders placed or performed during the hospital encounter of 07/23/23 (from the past 48 hour(s))  Lipase, blood     Status: None   Collection Time: 07/23/23  6:43 PM  Result Value Ref Range   Lipase 27 11 - 51 U/L    Comment:  Performed at [facility], [address] [place], [place] [address]  Comprehensive metabolic panel     Status: Abnormal   Collection Time: 07/23/23  6:43 PM  Result Value Ref Range   Sodium 137 135 - 145 mmol/L   Potassium 3.7 3.5 - 5.1 mmol/L   Chloride 105 98 - 111 mmol/L   CO2 24 22 - 32 mmol/L   Glucose, Bld 108 (H) 70 - 99 mg/dL    Comment: Glucose reference range applies only to samples taken after fasting for at least 8 hours.   BUN 13 6 - 20 mg/dL   Creatinine, Ser 0.10 0.61 - 1.24 mg/dL   Calcium 8.5 (L) 8.9 - 10.3 mg/dL   Total Protein 7.1 6.5 - 8.1 g/dL   Albumin 3.5 3.5 - 5.0 g/dL   AST 932 (H) 15 - 41 U/L   ALT 1,826 (H) 0 - 44 U/L   Alkaline Phosphatase 70 38 - 126 U/L   Total Bilirubin 6.3 (H) 0.3 - 1.2 mg/dL   GFR, Estimated >35 >57 mL/min    Comment: (NOTE) Calculated using the CKD-EPI Creatinine Equation (2021)    Anion gap 8 5 - 15    Comment: Performed at [facility], [address] [place], [place] [address]  CBC     Status: Abnormal   Collection Time: 07/23/23  6:43 PM  Result Value Ref Range   WBC 4.9  4.0 - 10.5 K/uL   RBC 4.77 4.22 - 5.81 MIL/uL   Hemoglobin 13.7 13.0 - 17.0 g/dL   HCT 44.0 10.2 - 72.5 %   MCV 82.6 80.0 - 100.0 fL   MCH 28.7 26.0 - 34.0 pg   MCHC 34.8 30.0 - 36.0 g/dL   RDW 36.6 (H) 44.0 - 34.7 %   Platelets 178 150 - 400 K/uL   nRBC 0.0 0.0 - 0.2 %    Comment: Performed at [facility], [address] [place], [place] [address]  Urinalysis, Routine w reflex microscopic -Urine, Clean Catch     Status: Abnormal   Collection Time: 07/23/23  8:45 PM  Result Value Ref Range   Color, Urine YELLOW YELLOW   APPearance HAZY (A) CLEAR   Specific Gravity, Urine 1.018 1.005 - 1.030   pH 6.0 5.0 - 8.0   Glucose, UA NEGATIVE NEGATIVE mg/dL   Hgb urine dipstick NEGATIVE NEGATIVE   Bilirubin Urine SMALL (A) NEGATIVE   Ketones, ur NEGATIVE NEGATIVE mg/dL   Protein, ur NEGATIVE  NEGATIVE mg/dL   Nitrite NEGATIVE NEGATIVE   Leukocytes,Ua NEGATIVE NEGATIVE   RBC / HPF 0-5 0 - 5 RBC/hpf   WBC, UA 0-5 0 - 5 WBC/hpf   Bacteria, UA NONE SEEN NONE SEEN   Squamous Epithelial / HPF 0-5 0 - 5 /HPF   Mucus PRESENT    Ca Oxalate Crys, UA PRESENT     Comment: Performed at [facility], [address] [place], [place] [address]   CT ABDOMEN PELVIS W CONTRAST  Result Date: 07/23/2023 CLINICAL DATA:  Jaundiced with hematuria. EXAM: CT ABDOMEN AND PELVIS WITH CONTRAST TECHNIQUE: Multidetector CT imaging of the abdomen and pelvis was performed using the standard protocol following bolus administration of intravenous contrast. RADIATION DOSE REDUCTION: This exam was performed according to the departmental dose-optimization program which includes automated exposure control, adjustment of the mA and/or kV according to patient size and/or use of iterative reconstruction technique. CONTRAST:  80mL OMNIPAQUE IOHEXOL 300 MG/ML  SOLN COMPARISON:  August 11, 2020 FINDINGS: Lower chest: No acute abnormality. Hepatobiliary: No focal liver abnormality is seen. The gallbladder is contracted without evidence of gallstones, gallbladder wall thickening, or biliary dilatation. Pancreas: Unremarkable. No pancreatic ductal dilatation or surrounding inflammatory changes. Spleen: Normal in size without focal abnormality. Adrenals/Urinary Tract: Adrenal glands are unremarkable. Kidneys are normal, without renal calculi, focal lesion, or hydronephrosis. Bladder is unremarkable. Stomach/Bowel: Stomach is within normal limits. Appendix appears normal. No evidence of bowel wall thickening, distention, or inflammatory changes. Vascular/Lymphatic: No significant vascular findings are present. No enlarged abdominal or pelvic lymph nodes. Reproductive: The prostate gland is mildly enlarged. Other: No abdominal wall hernia or abnormality. No abdominopelvic ascites. Musculoskeletal: No acute or  significant osseous findings. IMPRESSION: 1. No acute or active process within the abdomen or pelvis. 2. Mild prostatomegaly. Electronically Signed   By: Aram Candela M.D.   On: 07/23/2023 23:49    Pending Labs Unresulted Labs (From admission, onward)     Start     Ordered   07/23/23 2113  Hepatitis panel, acute  Once,   URGENT        07/23/23 2112            Vitals/Pain Today's Vitals   07/23/23 2100 07/23/23 2112 07/23/23 2115 07/23/23 2130  BP: 101/70   128/88  Pulse: (!) 59  (!) 55 67  Resp: 17     Temp:    98.4 F (36.9 C)  TempSrc:    Oral  SpO2: 98%  97% 98%  Weight:      Height:      PainSc:  0-No pain      Isolation Precautions No active isolations  Medications Medications  iohexol (OMNIPAQUE) 300 MG/ML solution 100 mL (80 mLs Intravenous Contrast Given 07/23/23 2200)    Mobility walks     Focused Assessments    R Recommendations: See Admitting Provider Note  Report given to:   Additional Notes:

## 2023-07-24 NOTE — H&P (Addendum)
PCP:   Patient, No Pcp Per   Chief Complaint:  Domino pain, jaundice  HPI: This is a 52 year old male with no significant past medical history.  He presents with complaints of generalized abdominal pain and cramping, ongoing since Wednesday.  He additionally reports nausea and vomiting, which occurred only on Wednesday.  He also had mild fevers that resolved spontaneously.  He denies recent travel, alcohol use, illicit drug use, new medications, over-the-counter medications, NSAIDs or Tylenol use.  He denies any sick contact.  He states he had mild diarrhea, occasionally dark in color and occasional light in color.  His urine is dark-colored.  He reports developing difficulty starting urination and experiences slow urine output since Wednesday.  Yesterday family noticed the patient was becoming jaundiced.  He was taken to the urgent care.  Labs confirm elevated LFTs and bilirubin.  He was directed to [facility]. Per daughter this is occurred once before years ago when patient was a [place] in the [place]. He was treated by doctors from the [facility]. He does not recall diagnosis given.  His symptoms resolved with no recurrence.  At [facility] labs noted AST 649, ALT 1,826, T. bili 6.3. CT abdomen pelvis was nonacute with no gallstones or common bile duct dilatation.  EDP discussed the case with Dr. Loreta Ave, GI.  Admission recommended.  They will do official consult in AM.  Patient's speaks a [place] language [place]ra[place].  His daughter Areta Haber Rahlam present at bedside translated.  Her phone number [phone].  Please call if needed for translation.  Patient's language is not available through translation service.  Review of Systems:  Per HPI  Past Medical History: HTN  Past Surgical History: None  Medications: Prior to Admission medications   None    Allergies:  No Known Allergies  Social History:  reports that he has never smoked. He has never used smokeless tobacco. He  reports that he does not drink alcohol and does not use drugs.  Family History: HTN  Physical Exam: Vitals:   07/23/23 2145 07/24/23 0114 07/24/23 0114 07/24/23 0130  BP:  129/81  (!) 131/98  Pulse: 67 (!) 59  64  Resp:   16   Temp:   97.6 F (36.4 C)   TempSrc:   Oral   SpO2: 99% 98%  98%  Weight:      Height:        General:  Alert and oriented times three, well developed and nourished, no acute distress Eyes: Positive scleral icterus ENT: Moist oral mucosa, neck supple, no thyromegaly Lungs: clear to ascultation, no wheeze, no crackles, no use of accessory muscles Cardiovascular: regular rate and rhythm, no regurgitation, no gallops, no murmurs Abdomen: soft, positive BS, mild nonspecific tenderness to palpation, no organomegaly GU: not examined Neuro: CN II - XII grossly intact, sensation intact Musculoskeletal: strength 5/5 all extremities, no  edema Skin: Mild jaundice noted Psych: appropriate patient   Labs on Admission:  Recent Labs    07/23/23 1843  NA 137  K 3.7  CL 105  CO2 24  GLUCOSE 108*  BUN 13  CREATININE 0.85  CALCIUM 8.5*   Recent Labs    07/23/23 1843  AST 649*  ALT 1,826*  ALKPHOS 70  BILITOT 6.3*  PROT 7.1  ALBUMIN 3.5   Recent Labs    07/23/23 1843  LIPASE 27   Recent Labs    07/23/23 1843  WBC 4.9  HGB 13.7  HCT 39.4  MCV 82.6  PLT 178     Radiological Exams on Admission: CT ABDOMEN PELVIS W CONTRAST  Result Date: 07/23/2023 CLINICAL DATA:  Jaundiced with hematuria. EXAM: CT ABDOMEN AND PELVIS WITH CONTRAST TECHNIQUE: Multidetector CT imaging of the abdomen and pelvis was performed using the standard protocol following bolus administration of intravenous contrast. RADIATION DOSE REDUCTION: This exam was performed according to the departmental dose-optimization program which includes automated exposure control, adjustment of the mA and/or kV according to patient size and/or use of iterative reconstruction technique.  CONTRAST:  80mL OMNIPAQUE IOHEXOL 300 MG/ML  SOLN COMPARISON:  August 11, 2020 FINDINGS: Lower chest: No acute abnormality. Hepatobiliary: No focal liver abnormality is seen. The gallbladder is contracted without evidence of gallstones, gallbladder wall thickening, or biliary dilatation. Pancreas: Unremarkable. No pancreatic ductal dilatation or surrounding inflammatory changes. Spleen: Normal in size without focal abnormality. Adrenals/Urinary Tract: Adrenal glands are unremarkable. Kidneys are normal, without renal calculi, focal lesion, or hydronephrosis. Bladder is unremarkable. Stomach/Bowel: Stomach is within normal limits. Appendix appears normal. No evidence of bowel wall thickening, distention, or inflammatory changes. Vascular/Lymphatic: No significant vascular findings are present. No enlarged abdominal or pelvic lymph nodes. Reproductive: The prostate gland is mildly enlarged. Other: No abdominal wall hernia or abnormality. No abdominopelvic ascites. Musculoskeletal: No acute or significant osseous findings. IMPRESSION: 1. No acute or active process within the abdomen or pelvis. 2. Mild prostatomegaly. Electronically Signed   By: Aram Candela M.D.   On: 07/23/2023 23:49    Assessment/Plan Present on Admission:  Transaminitis //  evaded LFTs -Abdominal pain -Hepatitis panel pending.  Lipase level normal -CT abdomen and pelvis, notable liver, no stones noted -GI consult placed -Avoid hepatotoxic medication -Pain meds as needed -UDS/alcohol level ordered.  Doubtful patient uses both of either -CMP, ammonia, INR in a.m.   Jacelyn Cuen 07/24/2023, 2:18 AM

## 2023-07-24 NOTE — Plan of Care (Signed)

## 2023-07-24 NOTE — Consult Note (Signed)
Consultation  Primary Care Physician:  Patient, No Pcp Per Primary Gastroenterologist:  Unassigned       Reason for Consultation: Transaminitis, abdominal pain DOA: 07/23/2023         Hospital Day: 2         HPI:   Cesar Reyes is a 52 y.o. male with no significant past medical history.  Presents to the ER with concern for abdominal pain, nausea, and vomiting. Also jaundiced skin and eyes. Dark colored urine.  Work up at [facility] notable for : AST 649, ALT 1,826, T. bili 6.3. CT abdomen pelvis was nonacute with no gallstones or common bile duct dilatation.  Patient lying in bed, daughter at bedside and provides translation. Patient reports he came into the hospital due to abdominal discomfort as well of concerns with jaundice sclera that started last Wednesday.Marland Kitchen He also reports he was having issues with urination and his urine was very dark.No itchy skin. Most days has regular bowel movement that is brown in color. No fever or chills. Documentation previously states patient presented with nausea, but no nausea or vomiting presently. Tolerated clear liquid diet. Reports back in 2004 when he lived in [place] he was hospitalized for a "stomach" issue, but unsure what the issue was. No recent travel or exposure. No vaccination history. Uses Tylenol prn. No alcohol use. Nonsmoker. No IV drug use. No family history of liver disease or cancer.   Patient with family at bedside, daughter. Provided some of the history.    Previous GI workup:   07/23/2023 CT ABDOMEN AND PELVIS WITH CONTRAST  IMPRESSION: 1. No acute or active process within the abdomen or pelvis. 2. Mild prostatomegaly.  Abnormal ED labs: Abnormal Labs Reviewed  COMPREHENSIVE METABOLIC PANEL - Abnormal; Notable for the following components:      Result Value   Glucose, Bld 108 (*)    Calcium 8.5 (*)    AST 649 (*)    ALT 1,826 (*)    Total Bilirubin 6.3 (*)    All other components within normal limits  CBC -  Abnormal; Notable for the following components:   RDW 16.4 (*)    All other components within normal limits  URINALYSIS, ROUTINE W REFLEX MICROSCOPIC - Abnormal; Notable for the following components:   APPearance HAZY (*)    Bilirubin Urine SMALL (*)    All other components within normal limits  COMPREHENSIVE METABOLIC PANEL - Abnormal; Notable for the following components:   Glucose, Bld 140 (*)    Calcium 8.7 (*)    Albumin 2.8 (*)    AST 640 (*)    ALT 1,680 (*)    Total Bilirubin 5.7 (*)    All other components within normal limits  CBC WITH DIFFERENTIAL/PLATELET - Abnormal; Notable for the following components:   WBC 3.7 (*)    RDW 16.4 (*)    All other components within normal limits  PROTIME-INR - Abnormal; Notable for the following components:   Prothrombin Time 16.8 (*)    INR 1.3 (*)    All other components within normal limits  AMMONIA - Abnormal; Notable for the following components:   Ammonia 54 (*)    All other components within normal limits  ACETAMINOPHEN LEVEL - Abnormal; Notable for the following components:   Acetaminophen (Tylenol), Serum <10 (*)    All other components within normal limits    History reviewed. No pertinent past medical history.  Surgical History:  He  has  no past surgical history on file. Family History:  His family history is not on file. Social History:   reports that he has never smoked. He has never used smokeless tobacco. He reports that he does not drink alcohol and does not use drugs.  Prior to Admission medications   Not on File    Current Facility-Administered Medications  Medication Dose Route Frequency Provider Last Rate Last Admin   0.9 % NaCl with KCl 20 mEq/ L  infusion   Intravenous Continuous Joneen Roach, Debby, MD 75 mL/hr at 07/24/23 0351 Infusion Verify at 07/24/23 0351   heparin injection 5,000 Units  5,000 Units Subcutaneous Q8H Crosley, Debby, MD   5,000 Units at 07/24/23 1403   HYDROmorphone (DILAUDID) injection  0.5 mg  0.5 mg Intravenous Q4H PRN Gery Pray, MD       ondansetron (ZOFRAN) injection 4 mg  4 mg Intravenous Q6H PRN Crosley, Debby, MD       senna-docusate (Senokot-S) tablet 1 tablet  1 tablet Oral QHS PRN Gery Pray, MD        Allergies as of 07/23/2023   (No Known Allergies)    Review of Systems:    Constitutional: No weight loss, fever, chills, weakness or fatigue HEENT: Eyes: No change in vision               Ears, Nose, Throat:  No change in hearing or congestion Skin: No rash or itching Cardiovascular: No chest pain, chest pressure or palpitations   Respiratory: No SOB or cough Gastrointestinal: See HPI and otherwise negative Genitourinary: No dysuria or change in urinary frequency Neurological: No headache, dizziness or syncope Musculoskeletal: No new muscle or joint pain Hematologic: No bleeding or bruising Psychiatric: No history of depression or anxiety     Physical Exam:  Vital signs in last 24 hours: Temp:  [97.6 F (36.4 C)-98.4 F (36.9 C)] 98.3 F (36.8 C) (09/24 0715) Pulse Rate:  [53-72] 54 (09/24 0715) Resp:  [16-18] 18 (09/24 0715) BP: (101-131)/(70-98) 120/77 (09/24 0715) SpO2:  [97 %-99 %] 98 % (09/24 0715) Weight:  [78.5 kg] 78.5 kg (09/23 1832)   Last BM recorded by nurses in past 5 days No data recorded  General:   Pleasant, well developed male in no acute distress Head:  Normocephalic and atraumatic. Eyes: scleral icterus,conjunctive pink  Heart:  regular rate and rhythm, no heart murmurs Pulm: Clear anteriorly; no wheezing Abdomen:  Soft, Obese AB, Active bowel sounds. mild tenderness in the periumbilical area. Without guarding and Without rebound, No organomegaly appreciated. Extremities:  Without edema. Msk:  Symmetrical without gross deformities. Peripheral pulses intact.  Neurologic:  Alert and  oriented x4;  No focal deficits.  Skin:   Dry and intact without significant lesions or rashes. Jaundiced. Psychiatric:  Cooperative.  Normal mood and affect.  LAB RESULTS: Recent Labs    07/23/23 1843 07/24/23 0416  WBC 4.9 3.7*  HGB 13.7 13.5  HCT 39.4 39.0  PLT 178 159   BMET Recent Labs    07/23/23 1843 07/24/23 0416  NA 137 136  K 3.7 3.7  CL 105 106  CO2 24 23  GLUCOSE 108* 140*  BUN 13 10  CREATININE 0.85 0.90  CALCIUM 8.5* 8.7*   LFT Recent Labs    07/24/23 0416  PROT 6.8  ALBUMIN 2.8*  AST 640*  ALT 1,680*  ALKPHOS 67  BILITOT 5.7*   PT/INR Recent Labs    07/24/23 0416  LABPROT 16.8*  INR 1.3*  STUDIES: CT ABDOMEN PELVIS W CONTRAST  Result Date: 07/23/2023 CLINICAL DATA:  Jaundiced with hematuria. EXAM: CT ABDOMEN AND PELVIS WITH CONTRAST TECHNIQUE: Multidetector CT imaging of the abdomen and pelvis was performed using the standard protocol following bolus administration of intravenous contrast. RADIATION DOSE REDUCTION: This exam was performed according to the departmental dose-optimization program which includes automated exposure control, adjustment of the mA and/or kV according to patient size and/or use of iterative reconstruction technique. CONTRAST:  80mL OMNIPAQUE IOHEXOL 300 MG/ML  SOLN COMPARISON:  August 11, 2020 FINDINGS: Lower chest: No acute abnormality. Hepatobiliary: No focal liver abnormality is seen. The gallbladder is contracted without evidence of gallstones, gallbladder wall thickening, or biliary dilatation. Pancreas: Unremarkable. No pancreatic ductal dilatation or surrounding inflammatory changes. Spleen: Normal in size without focal abnormality. Adrenals/Urinary Tract: Adrenal glands are unremarkable. Kidneys are normal, without renal calculi, focal lesion, or hydronephrosis. Bladder is unremarkable. Stomach/Bowel: Stomach is within normal limits. Appendix appears normal. No evidence of bowel wall thickening, distention, or inflammatory changes. Vascular/Lymphatic: No significant vascular findings are present. No enlarged abdominal or pelvic lymph nodes.  Reproductive: The prostate gland is mildly enlarged. Other: No abdominal wall hernia or abnormality. No abdominopelvic ascites. Musculoskeletal: No acute or significant osseous findings. IMPRESSION: 1. No acute or active process within the abdomen or pelvis. 2. Mild prostatomegaly. Electronically Signed   By: Aram Candela M.D.   On: 07/23/2023 23:49      Impression /Plan:  52 year old male patient presents with abdominal pain with elevated liver chemistries, mixed pattern. Rule out acute viral hepatitis, not medication related (no home meds).Tylenol negative.  INR minimally elevated, mentation normal. Alcohol ethanol level negative. AST  649>640    ALT 1826>1680    Ammonia 54.  PT 16.8  / INR 1.3 Unremarkable liver/gallbladder ducts on CT scan. -Daily INR/PT -Will order viral serologies ( EBV, CMV, HSV) and/or consider autoimmune disease if acute hepatitis panel negative. -Hepatitis panel pending -If hepatic serologic workup is negative, Annie Roseboom consider liver biopsy if liver tests do not improve. -Consider u/s with doppler though CT scan with contrast unrevealing.  -Avoid hepatotoxic medication.   Principal Problem:   Transaminitis Active Problems:   Abdominal pain   Hyperbilirubinemia   Jaundice   Elevated LFTs    LOS: 0 days    Thank you for your kind consultation, we will continue to follow.   Shanele Nissan J Psalm Schappell  07/24/2023, 2:26 PM

## 2023-07-24 NOTE — Progress Notes (Signed)
Patient admitted by Dr Joneen Roach earlier this am.  Please see note.     52 year old male with no significant past medical history.  He presents with complaints of generalized abdominal pain and cramping, ongoing since Wednesday associated with nausea or vomiting. He denies recent travel, alcohol use, illicit drug use, new medications, over-the-counter medications, NSAIDs or Tylenol use. CT abdomen pelvis was nonacute with no gallstones or common bile duct dilatation. Patient's speaks a [place] language [place].  His daughter Cesar Reyes present at bedside translated.  Her phone number [phone].    GI consulted.  Acute hepatitis panel is pending.    General exam: Appears calm and comfortable , jaundiced.  Respiratory system: Clear to auscultation. Respiratory effort normal. Cardiovascular system: S1 & S2 heard, RRR.  Gastrointestinal system: Abdomen is nondistended, soft and mild gen tenderness in the mid abdomen. Central nervous system: Alert and oriented. Extremities: Symmetric 5 x 5 power. Skin: No rashes,  Psychiatry: Mood & affect appropriate.    Plan:  Await results of the acute hepatitis panel. If no improvement will need liver biopsy.    Cesar Mody MD.

## 2023-07-25 DIAGNOSIS — R17 Unspecified jaundice: Secondary | ICD-10-CM | POA: Diagnosis not present

## 2023-07-25 DIAGNOSIS — R1084 Generalized abdominal pain: Secondary | ICD-10-CM | POA: Diagnosis not present

## 2023-07-25 DIAGNOSIS — R7401 Elevation of levels of liver transaminase levels: Secondary | ICD-10-CM | POA: Diagnosis not present

## 2023-07-25 LAB — COMPREHENSIVE METABOLIC PANEL
ALT: 1455 U/L — ABNORMAL HIGH (ref 0–44)
AST: 559 U/L — ABNORMAL HIGH (ref 15–41)
Albumin: 3 g/dL — ABNORMAL LOW (ref 3.5–5.0)
Alkaline Phosphatase: 65 U/L (ref 38–126)
Anion gap: 11 (ref 5–15)
BUN: 11 mg/dL (ref 6–20)
CO2: 21 mmol/L — ABNORMAL LOW (ref 22–32)
Calcium: 8.8 mg/dL — ABNORMAL LOW (ref 8.9–10.3)
Chloride: 103 mmol/L (ref 98–111)
Creatinine, Ser: 0.87 mg/dL (ref 0.61–1.24)
GFR, Estimated: >60 mL/min (ref >60)
Glucose, Bld: 122 mg/dL — ABNORMAL HIGH (ref 70–99)
Potassium: 3.9 mmol/L (ref 3.5–5.1)
Sodium: 135 mmol/L (ref 135–145)
Total Bilirubin: 4.6 mg/dL — ABNORMAL HIGH (ref 0.3–1.2)
Total Protein: 7.1 g/dL (ref 6.5–8.1)

## 2023-07-25 LAB — PROTIME-INR
INR: 1.2 (ref 0.8–1.2)
Prothrombin Time: 15.4 seconds — ABNORMAL HIGH (ref 11.4–15.2)

## 2023-07-25 LAB — CBC WITH DIFFERENTIAL/PLATELET
Abs Immature Granulocytes: 0.01 10*3/uL (ref 0.00–0.07)
Basophils Absolute: 0 10*3/uL (ref 0.0–0.1)
Basophils Relative: 0 %
Eosinophils Absolute: 0.1 10*3/uL (ref 0.0–0.5)
Eosinophils Relative: 2 %
HCT: 42.2 % (ref 39.0–52.0)
Hemoglobin: 14.3 g/dL (ref 13.0–17.0)
Immature Granulocytes: 0 %
Lymphocytes Relative: 33 %
Lymphs Abs: 1.3 10*3/uL (ref 0.7–4.0)
MCH: 29.4 pg (ref 26.0–34.0)
MCHC: 33.9 g/dL (ref 30.0–36.0)
MCV: 86.7 fL (ref 80.0–100.0)
Monocytes Absolute: 0.3 10*3/uL (ref 0.1–1.0)
Monocytes Relative: 9 %
Neutro Abs: 2.3 10*3/uL (ref 1.7–7.7)
Neutrophils Relative %: 56 %
Platelets: 165 10*3/uL (ref 150–400)
RBC: 4.87 MIL/uL (ref 4.22–5.81)
RDW: 16.7 % — ABNORMAL HIGH (ref 11.5–15.5)
WBC: 4 10*3/uL (ref 4.0–10.5)
nRBC: 0 % (ref 0.0–0.2)

## 2023-07-25 LAB — IRON AND TIBC
Iron: 158 ug/dL (ref 45–182)
Saturation Ratios: 61 % — ABNORMAL HIGH (ref 17.9–39.5)
TIBC: 260 ug/dL (ref 250–450)
UIBC: 102 ug/dL

## 2023-07-25 LAB — HEPATITIS PANEL, ACUTE
HCV Ab: NONREACTIVE
Hep A IgM: NONREACTIVE
Hep B C IgM: NONREACTIVE
Hepatitis B Surface Ag: REACTIVE — AB

## 2023-07-25 LAB — CK: Total CK: 69 U/L (ref 49–397)

## 2023-07-25 LAB — BILIRUBIN, DIRECT: Bilirubin, Direct: 2 mg/dL — ABNORMAL HIGH (ref 0.0–0.2)

## 2023-07-25 LAB — FERRITIN: Ferritin: 2044 ng/mL — ABNORMAL HIGH (ref 24–336)

## 2023-07-25 MED ORDER — SODIUM CHLORIDE 0.9 % IV SOLN: INTRAVENOUS | Status: DC

## 2023-07-25 NOTE — Progress Notes (Signed)
Progress Note    Cesar Reyes   ZHY:[id number]  DOB: May 22, 1971  DOA: 07/23/2023     0 PCP: Patient, No Pcp Per  Initial CC: abd pain  Hospital Course: Mr. Cesar Reyes is a 52 yo male with no known/pertinent PMH who presented with generalized abdominal pain and cramping for approx 1 week.  He also had mild fevers that had resolved prior to admission.  He also began developing some mild diarrhea, dark urine and appearance of jaundice, therefore he was brought to the ER for further evaluation. At [facility] labs noted AST 649, ALT 1,826, T. bili 6.3.  CT abdomen/pelvis was negative for acute abnormalities.  He was admitted for further workup and GI evaluation.  Interval History:  No events overnight. Daughter translated today during interview/exam. Says his abd pain has improved some.   Assessment and Plan: * Transaminitis - Labs from [facility] in May 2024 note normal hepatic function - on admission: AST 649, ALT 1,826, TB 6.3 - workup initiated on admission; GI following as well - differential for now includes acute Hep B vs hemochromatosis (less likely given recent normal labs in May) vs other AI process - hepatitis panel noted with HBsAg positivity; will check DNA VL; follow up Hep E IgM - ferritin noted to be high along with ULN iron and elevated sat ratio; concern would be for further testing of hemochromatosis  - follow up further pending studies -Continue trending LFTs  Abdominal pain - likely related to above -Pain was generalized and has been improving since admission   Old records reviewed in assessment of this patient  Antimicrobials:   DVT prophylaxis:  heparin injection 5,000 Units Start: 07/24/23 1400   Code Status:   Code Status: Full Code  Mobility Assessment (Last 72 Hours)     Mobility Assessment     Row Name 07/25/23 1200 07/25/23 0800 07/24/23 2128 07/24/23 1600 07/24/23 1200   Does patient have an order for bedrest or is patient medically unstable No  - Continue assessment No - Continue assessment No - Continue assessment No - Continue assessment No - Continue assessment   What is the highest level of mobility based on the progressive mobility assessment? Level 6 (Walks independently in room and hall) - Balance while walking in room without assist - Complete Level 6 (Walks independently in room and hall) - Balance while walking in room without assist - Complete Level 6 (Walks independently in room and hall) - Balance while walking in room without assist - Complete Level 6 (Walks independently in room and hall) - Balance while walking in room without assist - Complete Level 6 (Walks independently in room and hall) - Balance while walking in room without assist - Complete    Row Name 07/24/23 0800 07/24/23 0200         Does patient have an order for bedrest or is patient medically unstable No - Continue assessment No - Continue assessment      What is the highest level of mobility based on the progressive mobility assessment? Level 6 (Walks independently in room and hall) - Balance while walking in room without assist - Complete Level 5 (Walks with assist in room/hall) - Balance while stepping forward/back and can walk in room with assist - Complete               Barriers to discharge: none Disposition Plan:  Home Status is: Obs  Objective: Blood pressure 117/77, pulse (!) 51, temperature 98 F (36.7 C),  temperature source Oral, resp. rate 18, height 5\' 4"  (1.626 m), weight 78.5 kg, SpO2 95%.  Examination:  Physical Exam Constitutional:      General: He is not in acute distress.    Appearance: Normal appearance.  HENT:     Head: Normocephalic and atraumatic.     Mouth/Throat:     Mouth: Mucous membranes are moist.  Eyes:     Extraocular Movements: Extraocular movements intact.  Cardiovascular:     Rate and Rhythm: Normal rate and regular rhythm.  Pulmonary:     Effort: Pulmonary effort is normal. No respiratory distress.      Breath sounds: Normal breath sounds. No wheezing.  Abdominal:     General: Bowel sounds are normal. There is no distension.     Palpations: Abdomen is soft.     Tenderness: There is abdominal tenderness (very mild in left quadrants).  Musculoskeletal:        General: Normal range of motion.     Cervical back: Normal range of motion and neck supple.  Skin:    General: Skin is warm and dry.  Neurological:     General: No focal deficit present.     Mental Status: He is alert.  Psychiatric:        Mood and Affect: Mood normal.        Behavior: Behavior normal.      Consultants:  GI  Procedures:    Data Reviewed: Results for orders placed or performed during the hospital encounter of 07/23/23 (from the past 24 hour(s))  CBC with Differential/Platelet     Status: Abnormal   Collection Time: 07/25/23  7:08 AM  Result Value Ref Range   WBC 4.0 4.0 - 10.5 K/uL   RBC 4.87 4.22 - 5.81 MIL/uL   Hemoglobin 14.3 13.0 - 17.0 g/dL   HCT 78.2 95.6 - 21.3 %   MCV 86.7 80.0 - 100.0 fL   MCH 29.4 26.0 - 34.0 pg   MCHC 33.9 30.0 - 36.0 g/dL   RDW 08.6 (H) 57.8 - 46.9 %   Platelets 165 150 - 400 K/uL   nRBC 0.0 0.0 - 0.2 %   Neutrophils Relative % 56 %   Neutro Abs 2.3 1.7 - 7.7 K/uL   Lymphocytes Relative 33 %   Lymphs Abs 1.3 0.7 - 4.0 K/uL   Monocytes Relative 9 %   Monocytes Absolute 0.3 0.1 - 1.0 K/uL   Eosinophils Relative 2 %   Eosinophils Absolute 0.1 0.0 - 0.5 K/uL   Basophils Relative 0 %   Basophils Absolute 0.0 0.0 - 0.1 K/uL   Immature Granulocytes 0 %   Abs Immature Granulocytes 0.01 0.00 - 0.07 K/uL  Comprehensive metabolic panel     Status: Abnormal   Collection Time: 07/25/23  7:08 AM  Result Value Ref Range   Sodium 135 135 - 145 mmol/L   Potassium 3.9 3.5 - 5.1 mmol/L   Chloride 103 98 - 111 mmol/L   CO2 21 (L) 22 - 32 mmol/L   Glucose, Bld 122 (H) 70 - 99 mg/dL   BUN 11 6 - 20 mg/dL   Creatinine, Ser 6.29 0.61 - 1.24 mg/dL   Calcium 8.8 (L) 8.9 - 10.3 mg/dL    Total Protein 7.1 6.5 - 8.1 g/dL   Albumin 3.0 (L) 3.5 - 5.0 g/dL   AST 528 (H) 15 - 41 U/L   ALT 1,455 (H) 0 - 44 U/L   Alkaline Phosphatase 65 38 - 126 U/L  Total Bilirubin 4.6 (H) 0.3 - 1.2 mg/dL   GFR, Estimated >16 >10 mL/min   Anion gap 11 5 - 15  Protime-INR     Status: Abnormal   Collection Time: 07/25/23  7:08 AM  Result Value Ref Range   Prothrombin Time 15.4 (H) 11.4 - 15.2 seconds   INR 1.2 0.8 - 1.2  Ferritin     Status: Abnormal   Collection Time: 07/25/23  7:08 AM  Result Value Ref Range   Ferritin 2,044 (H) 24 - 336 ng/mL  Iron and TIBC     Status: Abnormal   Collection Time: 07/25/23  7:08 AM  Result Value Ref Range   Iron 158 45 - 182 ug/dL   TIBC 960 454 - 098 ug/dL   Saturation Ratios 61 (H) 17.9 - 39.5 %   UIBC 102 ug/dL  CK     Status: None   Collection Time: 07/25/23  7:08 AM  Result Value Ref Range   Total CK 69 49 - 397 U/L  Bilirubin, direct     Status: Abnormal   Collection Time: 07/25/23 12:23 PM  Result Value Ref Range   Bilirubin, Direct 2.0 (H) 0.0 - 0.2 mg/dL    I have reviewed pertinent nursing notes, vitals, labs, and images as necessary. I have ordered labwork to follow up on as indicated.  I have reviewed the last notes from staff over past 24 hours. I have discussed patient's care plan and test results with nursing staff, CM/SW, and other staff as appropriate.  Time spent: Greater than 50% of the 55 minute visit was spent in counseling/coordination of care for the patient as laid out in the A&P.   LOS: 0 days   Lewie Chamber, MD [facility] 07/25/2023, 4:21 PM

## 2023-07-25 NOTE — Plan of Care (Signed)
  Problem: Clinical Measurements: Goal: Diagnostic test results will improve Outcome: Not Progressing   

## 2023-07-25 NOTE — Plan of Care (Signed)

## 2023-07-25 NOTE — Progress Notes (Signed)
Gastroenterology Inpatient Follow Up    Subjective: Similar to yesterday but has slightly more energy.  Denies any abdominal pain.  Eating and drinking well.  Walking around without issues.  After discussion with the patient's family, we were able to figure out that the medication he took a month ago was naproxen.  Objective: Vital signs in last 24 hours: Temp:  [98 F (36.7 C)-98.2 F (36.8 C)] 98 F (36.7 C) (09/25 0751) Pulse Rate:  [50-62] 51 (09/25 0751) Resp:  [18] 18 (09/25 0751) BP: (117-135)/(77-94) 117/77 (09/25 0751) SpO2:  [95 %-98 %] 95 % (09/25 0751) Last BM Date : 07/23/23  Intake/Output from previous day: 09/24 0701 - 09/25 0700 In: 1653.6 [P.O.:100; I.V.:1553.6] Out: -  Intake/Output this shift: No intake/output data recorded.  General appearance: alert and cooperative Resp: no increased WOB Cardio: bradycardic GI: non-tender, non-distended Skin: jaundiced  Lab Results: Recent Labs    07/23/23 1843 07/24/23 0416 07/25/23 0708  WBC 4.9 3.7* 4.0  HGB 13.7 13.5 14.3  HCT 39.4 39.0 42.2  PLT 178 159 165   BMET Recent Labs    07/23/23 1843 07/24/23 0416 07/25/23 0708  NA 137 136 135  K 3.7 3.7 3.9  CL 105 106 103  CO2 24 23 21*  GLUCOSE 108* 140* 122*  BUN 13 10 11   CREATININE 0.85 0.90 0.87  CALCIUM 8.5* 8.7* 8.8*   LFT Recent Labs    07/25/23 0708  PROT 7.1  ALBUMIN 3.0*  AST 559*  ALT 1,455*  ALKPHOS 65  BILITOT 4.6*   PT/INR Recent Labs    07/24/23 0416 07/25/23 0708  LABPROT 16.8* 15.4*  INR 1.3* 1.2   Hepatitis Panel Recent Labs    07/23/23 2130  HEPBSAG PENDING  HCVAB NON REACTIVE  HEPAIGM NON REACTIVE  HEPBIGM NON REACTIVE   C-Diff No results for input(s): "CDIFFTOX" in the last 72 hours.  Studies/Results: CT ABDOMEN PELVIS W CONTRAST  Result Date: 07/23/2023 CLINICAL DATA:  Jaundiced with hematuria. EXAM: CT ABDOMEN AND PELVIS WITH CONTRAST TECHNIQUE: Multidetector CT imaging of the abdomen and  pelvis was performed using the standard protocol following bolus administration of intravenous contrast. RADIATION DOSE REDUCTION: This exam was performed according to the departmental dose-optimization program which includes automated exposure control, adjustment of the mA and/or kV according to patient size and/or use of iterative reconstruction technique. CONTRAST:  80mL OMNIPAQUE IOHEXOL 300 MG/ML  SOLN COMPARISON:  August 11, 2020 FINDINGS: Lower chest: No acute abnormality. Hepatobiliary: No focal liver abnormality is seen. The gallbladder is contracted without evidence of gallstones, gallbladder wall thickening, or biliary dilatation. Pancreas: Unremarkable. No pancreatic ductal dilatation or surrounding inflammatory changes. Spleen: Normal in size without focal abnormality. Adrenals/Urinary Tract: Adrenal glands are unremarkable. Kidneys are normal, without renal calculi, focal lesion, or hydronephrosis. Bladder is unremarkable. Stomach/Bowel: Stomach is within normal limits. Appendix appears normal. No evidence of bowel wall thickening, distention, or inflammatory changes. Vascular/Lymphatic: No significant vascular findings are present. No enlarged abdominal or pelvic lymph nodes. Reproductive: The prostate gland is mildly enlarged. Other: No abdominal wall hernia or abnormality. No abdominopelvic ascites. Musculoskeletal: No acute or significant osseous findings. IMPRESSION: 1. No acute or active process within the abdomen or pelvis. 2. Mild prostatomegaly. Electronically Signed   By: Aram Candela M.D.   On: 07/23/2023 23:49    Medications: I have reviewed the patient's current medications. Scheduled:  feeding supplement  1 Container Oral TID BM   heparin  5,000 Units Subcutaneous Q8H  Continuous:  sodium chloride     ZOX:[id number] (DILAUDID) injection, ondansetron (ZOFRAN) IV, senna-docusate  Assessment/Plan: 52 year old male without significant medical history presents with  jaundice and scleral icterus.  Found to have elevated LFTs.  CT was normal upon arrival.  LFTs have slowly downtrended over time.  Unclear what the inciting etiology of his elevated LFTs was, though could consider infection or DILI due to recent naproxen use.  Preliminary acute hepatitis panel was negative.  Ferritin and iron/TIBC was elevated so we will send HFE to evaluate for hemochromatosis.  CK level was normal.  Pending results of above, could consider liver biopsy.  -Continue to trend LFTs -Follow-up final result from acute hepatitis panel -Follow-up liver workup labs including ANA, IgG, ASMA, AMA, alpha 1 antitrypsin, ceruloplasmin -Check HFE   LOS: 0 days   Imogene Burn 07/25/2023, 12:59 PM

## 2023-07-25 NOTE — Hospital Course (Signed)
Mr. Gurian is a 52 yo male with no known/pertinent PMH who presented with generalized abdominal pain and cramping for approx 1 week.  He also had mild fevers that had resolved prior to admission.  He also began developing some mild diarrhea, dark urine and appearance of jaundice, therefore he was brought to the ER for further evaluation. At [facility] labs noted AST 649, ALT 1,826, T. bili 6.3.  CT abdomen/pelvis was negative for acute abnormalities.  He was admitted for further workup and GI evaluation.

## 2023-07-25 NOTE — Assessment & Plan Note (Addendum)
-   Labs from [facility] in May 2024 note normal hepatic function - on admission: AST 649, ALT 1,826, TB 6.3 - workup initiated on admission; GI following as well - hepatitis panel noted with HBsAg positivity; will check DNA VL; follow up Hep E IgM - ferritin noted to be high along with ULN iron and elevated sat ratio; concern would be for further testing of hemochromatosis  - LFTs showing slow downtrend and his abd pain has improved since admission along with tolerating diet well - he will follow up with GI for further review of pending tests and repeat of labs

## 2023-07-25 NOTE — Assessment & Plan Note (Signed)
-   likely related to above -Pain was generalized and has been improving since admission

## 2023-07-26 DIAGNOSIS — N400 Enlarged prostate without lower urinary tract symptoms: Secondary | ICD-10-CM | POA: Diagnosis present

## 2023-07-26 DIAGNOSIS — B181 Chronic viral hepatitis B without delta-agent: Secondary | ICD-10-CM | POA: Diagnosis present

## 2023-07-26 DIAGNOSIS — R7401 Elevation of levels of liver transaminase levels: Secondary | ICD-10-CM | POA: Diagnosis present

## 2023-07-26 DIAGNOSIS — I10 Essential (primary) hypertension: Secondary | ICD-10-CM | POA: Diagnosis present

## 2023-07-26 DIAGNOSIS — K7200 Acute and subacute hepatic failure without coma: Secondary | ICD-10-CM | POA: Diagnosis present

## 2023-07-26 DIAGNOSIS — R1084 Generalized abdominal pain: Secondary | ICD-10-CM | POA: Diagnosis present

## 2023-07-26 DIAGNOSIS — Z8249 Family history of ischemic heart disease and other diseases of the circulatory system: Secondary | ICD-10-CM | POA: Diagnosis not present

## 2023-07-26 LAB — COMPREHENSIVE METABOLIC PANEL
ALT: 1305 U/L — ABNORMAL HIGH (ref 0–44)
AST: 457 U/L — ABNORMAL HIGH (ref 15–41)
Albumin: 3.1 g/dL — ABNORMAL LOW (ref 3.5–5.0)
Alkaline Phosphatase: 81 U/L (ref 38–126)
Anion gap: 10 (ref 5–15)
BUN: 13 mg/dL (ref 6–20)
CO2: 21 mmol/L — ABNORMAL LOW (ref 22–32)
Calcium: 8.7 mg/dL — ABNORMAL LOW (ref 8.9–10.3)
Chloride: 103 mmol/L (ref 98–111)
Creatinine, Ser: 1.02 mg/dL (ref 0.61–1.24)
GFR, Estimated: >60 mL/min (ref >60)
Glucose, Bld: 94 mg/dL (ref 70–99)
Potassium: 3.8 mmol/L (ref 3.5–5.1)
Sodium: 134 mmol/L — ABNORMAL LOW (ref 135–145)
Total Bilirubin: 3.7 mg/dL — ABNORMAL HIGH (ref 0.3–1.2)
Total Protein: 7.6 g/dL (ref 6.5–8.1)

## 2023-07-26 LAB — CBC
HCT: 44.6 % (ref 39.0–52.0)
Hemoglobin: 14.6 g/dL (ref 13.0–17.0)
MCH: 28.3 pg (ref 26.0–34.0)
MCHC: 32.7 g/dL (ref 30.0–36.0)
MCV: 86.6 fL (ref 80.0–100.0)
Platelets: 194 10*3/uL (ref 150–400)
RBC: 5.15 MIL/uL (ref 4.22–5.81)
RDW: 17 % — ABNORMAL HIGH (ref 11.5–15.5)
WBC: 5.5 10*3/uL (ref 4.0–10.5)
nRBC: 0 % (ref 0.0–0.2)

## 2023-07-26 LAB — ANTI-SMOOTH MUSCLE ANTIBODY, IGG: F-Actin IgG: 15 Units (ref 0–19)

## 2023-07-26 LAB — HEPATITIS B DNA, ULTRAQUANTITATIVE, PCR
HBV DNA SERPL PCR-ACNC: 552000 IU/mL
HBV DNA SERPL PCR-LOG IU: 5.742 log10 IU/mL

## 2023-07-26 LAB — ALPHA-1-ANTITRYPSIN: A-1 Antitrypsin, Ser: 167 mg/dL (ref 101–187)

## 2023-07-26 LAB — IGG: IgG (Immunoglobin G), Serum: 2075 mg/dL — ABNORMAL HIGH (ref 603–1613)

## 2023-07-26 LAB — HEPATITIS B CORE ANTIBODY, IGM: Hep B C IgM: NONREACTIVE

## 2023-07-26 LAB — CERULOPLASMIN: Ceruloplasmin: 17.3 mg/dL (ref 16.0–31.0)

## 2023-07-26 LAB — ANA: Anti Nuclear Antibody (ANA): NEGATIVE

## 2023-07-26 LAB — PROTIME-INR
INR: 1.2 (ref 0.8–1.2)
Prothrombin Time: 15.2 seconds (ref 11.4–15.2)

## 2023-07-26 LAB — MITOCHONDRIAL ANTIBODIES: Mitochondrial M2 Ab, IgG: <20 Units (ref 0.0–20.0)

## 2023-07-26 NOTE — Progress Notes (Signed)
Progress Note    Cesar Reyes   ZDG:[id number]  DOB: [birth year]  DOA: 07/23/2023     0 PCP: Cesar Reyes  Initial CC: abd pain  Hospital Course: Cesar Reyes is a 52 yo male with no known/pertinent PMH who presented with generalized abdominal pain and cramping for approx 1 week.  He also had mild fevers that had resolved prior to admission.  He also began developing some mild diarrhea, dark urine and appearance of jaundice, therefore he was brought to the ER for further evaluation. At [facility] labs noted AST 649, ALT 1,826, T. bili 6.3.  CT abdomen/pelvis was negative for acute abnormalities.  He was admitted for further workup and GI evaluation.  Interval History:  No events overnight.  Abdominal pain still improving daily.  Denies any nausea.  Assessment and Plan: * Transaminitis - Labs from [facility] in May 2024 note normal hepatic function - on admission: AST 649, ALT 1,826, TB 6.3 - workup initiated on admission; GI following as well - differential for now includes acute Hep B vs hemochromatosis (less likely given recent normal labs in May) vs other AI process - hepatitis panel noted with HBsAg positivity; will check DNA VL; follow up Hep E IgM - ferritin noted to be high along with ULN iron and elevated sat ratio; concern would be for further testing of hemochromatosis  - follow up further pending studies -Continue trending LFTs  Abdominal pain - likely related to above -Pain was generalized and has been improving since admission   Old records reviewed in assessment of this patient  Antimicrobials:   DVT prophylaxis:  heparin injection 5,000 Units Start: 07/24/23 1400   Code Status:   Code Status: Full Code  Mobility Assessment (Last 72 Hours)     Mobility Assessment     Row Name 07/26/23 0902 07/26/23 0737 07/25/23 1925 07/25/23 1200 07/25/23 0800   Does patient have an order for bedrest or is patient medically unstable No - Continue assessment No -  Continue assessment No - Continue assessment No - Continue assessment No - Continue assessment   What is the highest level of mobility based on the progressive mobility assessment? Level 6 (Walks independently in room and hall) - Balance while walking in room without assist - Complete Level 6 (Walks independently in room and hall) - Balance while walking in room without assist - Complete Level 6 (Walks independently in room and hall) - Balance while walking in room without assist - Complete Level 6 (Walks independently in room and hall) - Balance while walking in room without assist - Complete Level 6 (Walks independently in room and hall) - Balance while walking in room without assist - Complete    Row Name 07/24/23 2128 07/24/23 1600 07/24/23 1200 07/24/23 0800 07/24/23 0200   Does patient have an order for bedrest or is patient medically unstable No - Continue assessment No - Continue assessment No - Continue assessment No - Continue assessment No - Continue assessment   What is the highest level of mobility based on the progressive mobility assessment? Level 6 (Walks independently in room and hall) - Balance while walking in room without assist - Complete Level 6 (Walks independently in room and hall) - Balance while walking in room without assist - Complete Level 6 (Walks independently in room and hall) - Balance while walking in room without assist - Complete Level 6 (Walks independently in room and hall) - Balance while walking in room without assist -  Complete Level 5 (Walks with assist in room/hall) - Balance while stepping forward/back and can walk in room with assist - Complete            Barriers to discharge: none Disposition Plan:  Home Status is: Obs  Objective: Blood pressure 113/80, pulse (!) 50, temperature 98.2 F (36.8 C), temperature source Oral, resp. rate 18, height 5\' 4"  (1.626 m), weight 78.5 kg, SpO2 96%.  Examination:  Physical Exam Constitutional:      General: He  is not in acute distress.    Appearance: Normal appearance.  HENT:     Head: Normocephalic and atraumatic.     Mouth/Throat:     Mouth: Mucous membranes are moist.  Eyes:     Extraocular Movements: Extraocular movements intact.  Cardiovascular:     Rate and Rhythm: Normal rate and regular rhythm.  Pulmonary:     Effort: Pulmonary effort is normal. No respiratory distress.     Breath sounds: Normal breath sounds. No wheezing.  Abdominal:     General: Bowel sounds are normal. There is no distension.     Palpations: Abdomen is soft.     Tenderness: There is abdominal tenderness (very mild in left quadrants).  Musculoskeletal:        General: Normal range of motion.     Cervical back: Normal range of motion and neck supple.  Skin:    General: Skin is warm and dry.  Neurological:     General: No focal deficit present.     Mental Status: He is alert.  Psychiatric:        Mood and Affect: Mood normal.        Behavior: Behavior normal.      Consultants:  GI  Procedures:    Data Reviewed: Results for orders placed or performed during the hospital encounter of 07/23/23 (from the past 24 hour(s))  Comprehensive metabolic panel     Status: Abnormal   Collection Time: 07/26/23  5:33 AM  Result Value Ref Range   Sodium 134 (L) 135 - 145 mmol/L   Potassium 3.8 3.5 - 5.1 mmol/L   Chloride 103 98 - 111 mmol/L   CO2 21 (L) 22 - 32 mmol/L   Glucose, Bld 94 70 - 99 mg/dL   BUN 13 6 - 20 mg/dL   Creatinine, Ser 1.61 0.61 - 1.24 mg/dL   Calcium 8.7 (L) 8.9 - 10.3 mg/dL   Total Protein 7.6 6.5 - 8.1 g/dL   Albumin 3.1 (L) 3.5 - 5.0 g/dL   AST 096 (H) 15 - 41 U/L   ALT 1,305 (H) 0 - 44 U/L   Alkaline Phosphatase 81 38 - 126 U/L   Total Bilirubin 3.7 (H) 0.3 - 1.2 mg/dL   GFR, Estimated >04 >54 mL/min   Anion gap 10 5 - 15  Protime-INR     Status: None   Collection Time: 07/26/23  5:33 AM  Result Value Ref Range   Prothrombin Time 15.2 11.4 - 15.2 seconds   INR 1.2 0.8 - 1.2   CBC     Status: Abnormal   Collection Time: 07/26/23  5:33 AM  Result Value Ref Range   WBC 5.5 4.0 - 10.5 K/uL   RBC 5.15 4.22 - 5.81 MIL/uL   Hemoglobin 14.6 13.0 - 17.0 g/dL   HCT 09.8 11.9 - 14.7 %   MCV 86.6 80.0 - 100.0 fL   MCH 28.3 26.0 - 34.0 pg   MCHC 32.7 30.0 - 36.0 g/dL  RDW 17.0 (H) 11.5 - 15.5 %   Platelets 194 150 - 400 K/uL   nRBC 0.0 0.0 - 0.2 %    I have reviewed pertinent nursing notes, vitals, labs, and images as necessary. I have ordered labwork to follow up on as indicated.  I have reviewed the last notes from staff over past 24 hours. I have discussed patient's care plan and test results with nursing staff, CM/SW, and other staff as appropriate.    LOS: 0 days   Lewie Chamber, MD [facility] 07/26/2023, 1:41 PM

## 2023-07-26 NOTE — Plan of Care (Signed)

## 2023-07-26 NOTE — Progress Notes (Signed)
    Progress Note   Subjective  Chief Complaint: Abdominal pain and elevated LFTs  Today, the patient is surrounded by his family will translate for him.  Apparently tells me that sometimes his abdomen is bloated, typically after eating and then it will go back down, no further abdominal pain.  They are asking if we are going to have to do surgery.   Objective   Vital signs in last 24 hours: Temp:  [98 F (36.7 C)-98.2 F (36.8 C)] 98.2 F (36.8 C) (09/26 0737) Pulse Rate:  [50-56] 50 (09/26 0737) Resp:  [18] 18 (09/26 0318) BP: (113-141)/(80-81) 113/80 (09/26 0737) SpO2:  [96 %-97 %] 96 % (09/26 0737) Last BM Date : 07/23/23 General:    Mopntagnard male in NAD Heart:  Regular rate and rhythm; no murmurs Lungs: Respirations even and unlabored, lungs CTA bilaterally Abdomen:  Soft, nontender and nondistended. Normal bowel sounds. Psych:  Cooperative. Normal mood and affect.  Intake/Output from previous day: 09/25 0701 - 09/26 0700 In: 1000 [I.V.:1000] Out: -    Lab Results: Recent Labs    07/24/23 0416 07/25/23 0708 07/26/23 0533  WBC 3.7* 4.0 5.5  HGB 13.5 14.3 14.6  HCT 39.0 42.2 44.6  PLT 159 165 194   BMET Recent Labs    07/24/23 0416 07/25/23 0708 07/26/23 0533  NA 136 135 134*  K 3.7 3.9 3.8  CL 106 103 103  CO2 23 21* 21*  GLUCOSE 140* 122* 94  BUN 10 11 13   CREATININE 0.90 0.87 1.02  CALCIUM 8.7* 8.8* 8.7*      Latest Ref Rng & Units 07/26/2023    5:33 AM 07/25/2023   12:23 PM 07/25/2023    7:08 AM  Hepatic Function  Total Protein 6.5 - 8.1 g/dL 7.6   7.1   Albumin 3.5 - 5.0 g/dL 3.1   3.0   AST 15 - 41 U/L 457   559   ALT 0 - 44 U/L 1,305   1,455   Alk Phosphatase 38 - 126 U/L 81   65   Total Bilirubin 0.3 - 1.2 mg/dL 3.7   4.6   Bilirubin, Direct 0.0 - 0.2 mg/dL  2.0       PT/INR Recent Labs    07/25/23 0708 07/26/23 0533  LABPROT 15.4* 15.2  INR 1.2 1.2    Assessment / Plan:   Assessment: 1.  Elevated LFTs with abdominal pain:  Presented with jaundice and scleral icterus, found to have elevated LFTs, CT normal, LFTs are slowly trending down for the most part, hep B surface antigen is positive, hep B core IgM pending, other liver workup also pending  Plan: 1.  Await further liver workup results 2.  Continue to trend LFTs 3.  Continue supportive measures  Thank you for your kind consultation, we will continue to follow.   LOS: 0 days   Unk Lightning  07/26/2023, 12:24 PM

## 2023-07-27 DIAGNOSIS — R7401 Elevation of levels of liver transaminase levels: Secondary | ICD-10-CM | POA: Diagnosis not present

## 2023-07-27 DIAGNOSIS — R1084 Generalized abdominal pain: Secondary | ICD-10-CM | POA: Diagnosis not present

## 2023-07-27 LAB — COMPREHENSIVE METABOLIC PANEL
ALT: 1110 U/L — ABNORMAL HIGH (ref 0–44)
AST: 394 U/L — ABNORMAL HIGH (ref 15–41)
Albumin: 3.1 g/dL — ABNORMAL LOW (ref 3.5–5.0)
Alkaline Phosphatase: 64 U/L (ref 38–126)
Anion gap: 4 — ABNORMAL LOW (ref 5–15)
BUN: 14 mg/dL (ref 6–20)
CO2: 28 mmol/L (ref 22–32)
Calcium: 9.5 mg/dL (ref 8.9–10.3)
Chloride: 105 mmol/L (ref 98–111)
Creatinine, Ser: 0.89 mg/dL (ref 0.61–1.24)
GFR, Estimated: >60 mL/min (ref >60)
Glucose, Bld: 100 mg/dL — ABNORMAL HIGH (ref 70–99)
Potassium: 3.7 mmol/L (ref 3.5–5.1)
Sodium: 137 mmol/L (ref 135–145)
Total Bilirubin: 3.6 mg/dL — ABNORMAL HIGH (ref 0.3–1.2)
Total Protein: 7.3 g/dL (ref 6.5–8.1)

## 2023-07-27 NOTE — Discharge Summary (Signed)
Physician Discharge Summary   Cesar Reyes WUJ:[id number] DOB: 1970-12-26 DOA: 07/23/2023  PCP: Patient, No Pcp Per  Admit date: 07/23/2023 Discharge date: 07/27/2023   Admitted From: Home Disposition:  Home Discharging physician: Lewie Chamber, MD Barriers to discharge: none  Recommendations at discharge: Follow up all ordered tests; multiple pending at discharge May need referral to ID for chronic Hep B treatment  Repeat CMP  Discharge Condition: stable CODE STATUS: Full Diet recommendation:  Diet Orders (From admission, onward)     Start     Ordered   07/27/23 0000  Diet general        07/27/23 1228   07/25/23 1330  Diet regular Room service appropriate? Yes; Fluid consistency: Thin  Diet effective now       Question Answer Comment  Room service appropriate? Yes   Fluid consistency: Thin      07/25/23 1329            Hospital Course: Mr. Winrow is a 52 yo male with no known/pertinent PMH who presented with generalized abdominal pain and cramping for approx 1 week.  He also had mild fevers that had resolved prior to admission.  He also began developing some mild diarrhea, dark urine and appearance of jaundice, therefore he was brought to the ER for further evaluation. At [facility] labs noted AST 649, ALT 1,826, T. bili 6.3.  CT abdomen/pelvis was negative for acute abnormalities.  He was admitted for further workup and GI evaluation.  Assessment and Plan: * Transaminitis - Labs from [facility] in May 2024 note normal hepatic function - on admission: AST 649, ALT 1,826, TB 6.3 - workup initiated on admission; GI following as well - hepatitis panel noted with HBsAg positivity; will check DNA VL; follow up Hep E IgM - ferritin noted to be high along with ULN iron and elevated sat ratio; concern would be for further testing of hemochromatosis  - LFTs showing slow downtrend and his abd pain has improved since admission along with tolerating diet well - he will follow  up with GI for further review of pending tests and repeat of labs  Abdominal pain-resolved as of 07/27/2023 - likely related to above -Pain was generalized and has been improving since admission   The patient's acute and chronic medical conditions were treated accordingly. On day of discharge, patient was felt deemed stable for discharge. Patient/family member advised to call PCP or come back to ER if needed.   Principal Diagnosis: Transaminitis  Discharge Diagnoses: Active Hospital Problems   Diagnosis Date Noted   Transaminitis 07/24/2023    Priority: 1.    Resolved Hospital Problems   Diagnosis Date Noted Date Resolved   Abdominal pain 07/24/2023 07/27/2023    Priority: 2.     Discharge Instructions     Diet general   Complete by: As directed       Allergies as of 07/27/2023   No Known Allergies      Medication List    You have not been prescribed any medications.     Follow-up Information     Lovie Macadamia, MD Follow up.   Specialty: Internal Medicine Why: TIME : 3:15 PM DATE : OCTOBER 16 , 2024 LOCATION: [facility] FLO[address] [place], [place] [address] Contact information: [address] [place] [address] [phone]         Imogene Burn, MD. Schedule an appointment as soon as possible for a  visit in 1 week(s).   Specialty: Gastroenterology Contact information: [address] [place] [address] [phone]                No Known Allergies  Consultations: GI  Procedures:   Discharge Exam: BP 114/76   Pulse 60   Temp 98.1 F (36.7 C) (Oral)   Resp 18   Ht 5\' 4"  (1.626 m)   Wt 78.5 kg   SpO2 97%   BMI 29.70 kg/m  Physical Exam Constitutional:      General: He is not in acute distress.    Appearance: Normal appearance.  HENT:     Head: Normocephalic and atraumatic.     Mouth/Throat:     Mouth: Mucous membranes are moist.  Eyes:     Extraocular Movements:  Extraocular movements intact.  Cardiovascular:     Rate and Rhythm: Normal rate and regular rhythm.  Pulmonary:     Effort: Pulmonary effort is normal. No respiratory distress.     Breath sounds: Normal breath sounds. No wheezing.  Abdominal:     General: Bowel sounds are normal. There is no distension.     Palpations: Abdomen is soft.     Tenderness: There is no abdominal tenderness.  Musculoskeletal:        General: Normal range of motion.     Cervical back: Normal range of motion and neck supple.  Skin:    General: Skin is warm and dry.  Neurological:     General: No focal deficit present.     Mental Status: He is alert.  Psychiatric:        Mood and Affect: Mood normal.        Behavior: Behavior normal.      The results of significant diagnostics from this hospitalization (including imaging, microbiology, ancillary and laboratory) are listed below for reference.   Microbiology: No results found for this or any previous visit (from the past 240 hour(s)).   Labs: BNP (last 3 results) No results for input(s): "BNP" in the last 8760 hours. Basic Metabolic Panel: Recent Labs  Lab 07/23/23 1843 07/24/23 0416 07/25/23 0708 07/26/23 0533 07/27/23 0944  NA 137 136 135 134* 137  K 3.7 3.7 3.9 3.8 3.7  CL 105 106 103 103 105  CO2 24 23 21* 21* 28  GLUCOSE 108* 140* 122* 94 100*  BUN 13 10 11 13 14   CREATININE 0.85 0.90 0.87 1.02 0.89  CALCIUM 8.5* 8.7* 8.8* 8.7* 9.5  MG  --  1.9  --   --   --    Liver Function Tests: Recent Labs  Lab 07/23/23 1843 07/24/23 0416 07/25/23 0708 07/26/23 0533 07/27/23 0944  AST 649* 640* 559* 457* 394*  ALT 1,826* 1,680* 1,455* 1,305* 1,110*  ALKPHOS 70 67 65 81 64  BILITOT 6.3* 5.7* 4.6* 3.7* 3.6*  PROT 7.1 6.8 7.1 7.6 7.3  ALBUMIN 3.5 2.8* 3.0* 3.1* 3.1*   Recent Labs  Lab 07/23/23 1843  LIPASE 27   Recent Labs  Lab 07/24/23 0416  AMMONIA 54*   CBC: Recent Labs  Lab 07/23/23 1843 07/24/23 0416 07/25/23 0708  07/26/23 0533  WBC 4.9 3.7* 4.0 5.5  NEUTROABS  --  2.1 2.3  --   HGB 13.7 13.5 14.3 14.6  HCT 39.4 39.0 42.2 44.6  MCV 82.6 85.2 86.7 86.6  PLT 178 159 165 194   Cardiac Enzymes: Recent Labs  Lab 07/25/23 0708  CKTOTAL 69   BNP: Invalid  input(s): "POCBNP" CBG: No results for input(s): "GLUCAP" in the last 168 hours. D-Dimer No results for input(s): "DDIMER" in the last 72 hours. Hgb A1c No results for input(s): "HGBA1C" in the last 72 hours. Lipid Profile No results for input(s): "CHOL", "HDL", "LDLCALC", "TRIG", "CHOLHDL", "LDLDIRECT" in the last 72 hours. Thyroid function studies No results for input(s): "TSH", "T4TOTAL", "T3FREE", "THYROIDAB" in the last 72 hours.  Invalid input(s): "FREET3" Anemia work up Recent Labs    07/25/23 0708  FERRITIN 2,044*  TIBC 260  IRON 158   Urinalysis    Component Value Date/Time   COLORURINE YELLOW 07/23/2023 2045   APPEARANCEUR HAZY (A) 07/23/2023 2045   LABSPEC 1.018 07/23/2023 2045   PHURINE 6.0 07/23/2023 2045   GLUCOSEU NEGATIVE 07/23/2023 2045   HGBUR NEGATIVE 07/23/2023 2045   BILIRUBINUR SMALL (A) 07/23/2023 2045   KETONESUR NEGATIVE 07/23/2023 2045   PROTEINUR NEGATIVE 07/23/2023 2045   NITRITE NEGATIVE 07/23/2023 2045   LEUKOCYTESUR NEGATIVE 07/23/2023 2045   Sepsis Labs Recent Labs  Lab 07/23/23 1843 07/24/23 0416 07/25/23 0708 07/26/23 0533  WBC 4.9 3.7* 4.0 5.5   Microbiology No results found for this or any previous visit (from the past 240 hour(s)).  Procedures/Studies: CT ABDOMEN PELVIS W CONTRAST  Result Date: 07/23/2023 CLINICAL DATA:  Jaundiced with hematuria. EXAM: CT ABDOMEN AND PELVIS WITH CONTRAST TECHNIQUE: Multidetector CT imaging of the abdomen and pelvis was performed using the standard protocol following bolus administration of intravenous contrast. RADIATION DOSE REDUCTION: This exam was performed according to the departmental dose-optimization program which includes automated  exposure control, adjustment of the mA and/or kV according to patient size and/or use of iterative reconstruction technique. CONTRAST:  80mL OMNIPAQUE IOHEXOL 300 MG/ML  SOLN COMPARISON:  August 11, 2020 FINDINGS: Lower chest: No acute abnormality. Hepatobiliary: No focal liver abnormality is seen. The gallbladder is contracted without evidence of gallstones, gallbladder wall thickening, or biliary dilatation. Pancreas: Unremarkable. No pancreatic ductal dilatation or surrounding inflammatory changes. Spleen: Normal in size without focal abnormality. Adrenals/Urinary Tract: Adrenal glands are unremarkable. Kidneys are normal, without renal calculi, focal lesion, or hydronephrosis. Bladder is unremarkable. Stomach/Bowel: Stomach is within normal limits. Appendix appears normal. No evidence of bowel wall thickening, distention, or inflammatory changes. Vascular/Lymphatic: No significant vascular findings are present. No enlarged abdominal or pelvic lymph nodes. Reproductive: The prostate gland is mildly enlarged. Other: No abdominal wall hernia or abnormality. No abdominopelvic ascites. Musculoskeletal: No acute or significant osseous findings. IMPRESSION: 1. No acute or active process within the abdomen or pelvis. 2. Mild prostatomegaly. Electronically Signed   By: Aram Candela M.D.   On: 07/23/2023 23:49     Time coordinating discharge: Over 30 minutes    Lewie Chamber, MD  [facility] 07/27/2023, 3:15 PM

## 2023-07-27 NOTE — Progress Notes (Signed)
    Progress Note   Subjective  Chief Complaint: Elevated LFTs  Patient continues to do well this morning, does have some generalized abdominal discomfort and bloating but is eating well.   Objective   Vital signs in last 24 hours: Temp:  [98.1 F (36.7 C)-98.3 F (36.8 C)] 98.1 F (36.7 C) (09/27 0805) Pulse Rate:  [55-71] 60 (09/27 0805) Resp:  [18-19] 18 (09/27 0619) BP: (114-124)/(74-85) 114/76 (09/27 0805) SpO2:  [94 %-97 %] 97 % (09/27 0805) Last BM Date : 07/26/23 General:   Montagnard male in NAD Heart:  Regular rate and rhythm; no murmurs Lungs: Respirations even and unlabored, lungs CTA bilaterally Abdomen:  Soft, nontender and nondistended. Normal bowel sounds. Psych:  Cooperative. Normal mood and affect.  Intake/Output from previous day: 09/26 0701 - 09/27 0700 In: 840 [P.O.:240; I.V.:600] Out: -   Lab Results: Recent Labs    07/25/23 0708 07/26/23 0533  WBC 4.0 5.5  HGB 14.3 14.6  HCT 42.2 44.6  PLT 165 194   BMET Recent Labs    07/25/23 0708 07/26/23 0533  NA 135 134*  K 3.9 3.8  CL 103 103  CO2 21* 21*  GLUCOSE 122* 94  BUN 11 13  CREATININE 0.87 1.02  CALCIUM 8.8* 8.7*      Latest Ref Rng & Units 07/26/2023    5:33 AM 07/25/2023   12:23 PM 07/25/2023    7:08 AM  Hepatic Function  Total Protein 6.5 - 8.1 g/dL 7.6   7.1   Albumin 3.5 - 5.0 g/dL 3.1   3.0   AST 15 - 41 U/L 457   559   ALT 0 - 44 U/L 1,305   1,455   Alk Phosphatase 38 - 126 U/L 81   65   Total Bilirubin 0.3 - 1.2 mg/dL 3.7   4.6   Bilirubin, Direct 0.0 - 0.2 mg/dL  2.0       PT/INR Recent Labs    07/25/23 0708 07/26/23 0533  LABPROT 15.4* 15.2  INR 1.2 1.2    Assessment / Plan:   Assessment: 1.  Elevated LFTs and abdominal pain: Presented with jaundice and scleral icterus, found to have elevated LFTs, CT normal, LFTs are slowly trending down for the most part, chronic hep B infection per labs, question of this plus additional insult of DILI with recent Naproxen  use versus other type of infection, hep B DNA is still in process and hemochromatosis still in process, other hep B labs also in process as well as to see if he is immunized against hepatitis A.  No indications for acute hep B treatment at this time, IgG mildly elevated 2075, ASMA and ANA are negative, ceruloplasmin alpha-1 antitrypsin are negative, AMA negative  Plan: 1.  Okay with patient discharge home. 2.  Patient will have recheck of LFTs in our outpatient clinic in a week 3.  Patient will have follow-up in our outpatient clinic in the next 2 to 3 months 4.  Will also refer patient to infectious disease outpatient for follow-up of his chronic hepatitis B infection. 5.  We will sign off  Thank you for your kind consultation.    LOS: 1 day   Unk Lightning  07/27/2023, 10:58 AM

## 2023-07-31 ENCOUNTER — Other Ambulatory Visit: Payer: Self-pay | Admitting: *Deleted

## 2023-07-31 ENCOUNTER — Telehealth: Payer: Self-pay | Admitting: *Deleted

## 2023-07-31 DIAGNOSIS — R7989 Other specified abnormal findings of blood chemistry: Secondary | ICD-10-CM

## 2023-07-31 DIAGNOSIS — B1911 Unspecified viral hepatitis B with hepatic coma: Secondary | ICD-10-CM

## 2023-07-31 NOTE — Telephone Encounter (Signed)
-----   Message from Unk Lightning sent at 07/27/2023 10:56 AM EDT ----- Regarding: Follow up appt and Referral to ID Patient is being discharged from the hospital within the next day or 2 for elevated liver enzymes, needs: 1. follow-up in our clinic with Dr. Leonides Schanz in the next 2 to 3 months  2. referral to infectious disease for chronic hepatitis B diagnosed at this hospitalization.   3. recheck of LFTs in a week, please order under Dr. Derek Mound name.  Thanks, JL L

## 2023-07-31 NOTE — Telephone Encounter (Addendum)
Called patient and unable to contact, called interpreter as noted in chart. Interpreter speaks [place] and is not certain if the patient will understand the language. Called the patient's daughter and she answered speaking [place]. Informed the daughter about the f/u appt that needed to be made to see Dr. Leonides Schanz, also informed the daughter of the lab work that needs to be gotten as well as the referral for infectious disease for Hepatitis B and insured her that the office will be calling her to set up an appt for her father. Daughter states she is at work and asked if I could call back to speak with her later. Informed the daughter that she could call back when she is able to do so, while at work, within the hour. She agreed. Orders have been placed for the Infectious Disease Referral and lab work for LFT's. A scheduled appt with Dr. Leonides Schanz is needed at this time within the next 2-3 months.

## 2023-08-01 LAB — MISC LABCORP TEST (SEND OUT): Labcorp test code: 9985

## 2023-08-02 NOTE — Telephone Encounter (Signed)
Called patient's daughter to confirm a f/u visit with Dr. Leonides Schanz on 11/20/23 at 3:40p. Daughter also notified of infectious disease referral due to Hep B. Informed her the center will call to notify of scheduled appt. Also informed daughter, the patient needs to have labs drawn. Hours of operation given to daughter and location of lab in the basement of the [facility] building located at [address] [place]. Daughter understood and agreed.

## 2023-08-07 LAB — HEMOCHROMATOSIS DNA-PCR(C282Y,H63D)

## 2023-08-15 ENCOUNTER — Ambulatory Visit: Payer: No Typology Code available for payment source | Admitting: Student

## 2023-08-15 VITALS — BP 136/84 | HR 77 | Temp 97.8°F | Ht 64.0 in | Wt 174.0 lb

## 2023-08-15 DIAGNOSIS — R7401 Elevation of levels of liver transaminase levels: Secondary | ICD-10-CM | POA: Diagnosis not present

## 2023-08-15 DIAGNOSIS — Z0000 Encounter for general adult medical examination without abnormal findings: Secondary | ICD-10-CM

## 2023-08-15 DIAGNOSIS — R82998 Other abnormal findings in urine: Secondary | ICD-10-CM

## 2023-08-15 DIAGNOSIS — R7989 Other specified abnormal findings of blood chemistry: Secondary | ICD-10-CM

## 2023-08-15 DIAGNOSIS — B181 Chronic viral hepatitis B without delta-agent: Secondary | ICD-10-CM

## 2023-08-15 LAB — PROTIME-INR
INR: 1.2 (ref 0.8–1.2)
Prothrombin Time: 15.4 s — ABNORMAL HIGH (ref 11.4–15.2)

## 2023-08-15 NOTE — Progress Notes (Unsigned)
    Subjective:  CC: Establish care  HPI:  [name] is a 52 y.o. person with a past medical history stated below and presents today for establishing care following hospitalization with acute hepatitis, and subsequently found to be hepatitis B positive. Please see problem based assessment and plan for additional details.  No past medical history on file.  No current outpatient medications on file prior to visit.   No current facility-administered medications on file prior to visit.    Review of Systems: Please see assessment and plan for pertinent positives and negatives.  Objective:   Vitals:   08/15/23 1529  BP: 136/84  Pulse: 77  Temp: 97.8 F (36.6 C)  TempSrc: Oral  SpO2: 100%  Weight: 174 lb (78.9 kg)  Height: 5\' 4"  (1.626 m)    Physical Exam: Constitutional: Well-appearing, in no acute distress Cardiovascular: regular rate and rhythm, no m/r/g Pulmonary/Chest: normal work of breathing on room air, lungs clear to auscultation bilaterally Abdominal: soft, non-tender, non-distended Extremities: No edema of the lower extremities bilaterally Psych: Pleasant mood and affect   Assessment & Plan:  Elevated LFTs Patient presented to the ED with generalized abdominal pain, nausea, vomiting, and malaise on 07/24/2023.  He was subsequently found to have elevated LFTs found to be hep B positive.  He says that his sisters all died of liver disease, but unclear exactly why.  His condition improved and he was deemed stable for discharge.  He is scheduled to see GI on November 20, 2023. Workup unrevealing of why he had a bout of acute hepatitis.  Today he denies nausea, vomiting, abdominal pain, or constipation.  He endorses 1-2 bouts of diarrhea since he left the hospital.  He does endorse dark urine since leaving the hospital as well as well as continued fatigue and malaise.  He describes episodes of dizziness in the past, but denies them at this time.  Plan: Hepatitis B E  antigen Hepatitis D IgG IgM Repeat LFTs PT/INR UA Referral to infectious disease, Dr. Ninetta Lights   Healthcare maintenance New patient establishing care, never had a colonoscopy, HIV screening, or lipid profile in this gentleman with BMI near 30.  He does not take any medications at this time. Plan: Lipid panel A1c HIV Referral for colonoscopy    Patient seen with Dr. Heide Scales MD [facility]  PGY-1 Pager: [phone]  Phone: [phone] Date 08/16/2023  Time 10:01 PM

## 2023-08-15 NOTE — Patient Instructions (Signed)
Thank you, Mr.Deanta Cutbirth for allowing [place] to provide your care today.  I have ordered the following tests for you:   Lab Orders         Hepatitis B e antibody,HBeAb (02725)         CMP14 + Anion Gap         Urinalysis, Reflex Microscopic         Lipid Profile         Hemoglobin A1c         HIV antibody (with reflex)         Protime-INR       Referrals ordered today:    Referral Orders         Amb Referral to Colonoscopy         Ambulatory referral to Infectious Disease      I have ordered the following medication/changed the following medications:   Stop the following medications: There are no discontinued medications.   Start the following medications: No orders of the defined types were placed in this encounter.      Follow up: 2-3 months for Routine follow up   We look forward to seeing you next time. Please call our clinic at [phone] if you have any questions or concerns. The best time to call is Monday-Friday from 9am-4pm, but there is someone available 24/7. If after hours or the weekend, call the main hospital number and ask for the Internal Medicine Resident On-Call. If you need medication refills, please notify your pharmacy one week in advance and they will send [place] a request.   Thank you for trusting me with your care. Wishing you the best!  Lovie Macadamia MD [facility]

## 2023-08-16 DIAGNOSIS — Z0000 Encounter for general adult medical examination without abnormal findings: Secondary | ICD-10-CM | POA: Insufficient documentation

## 2023-08-16 LAB — URINALYSIS, ROUTINE W REFLEX MICROSCOPIC
Bilirubin, UA: NEGATIVE
Glucose, UA: NEGATIVE
Ketones, UA: NEGATIVE
Leukocytes,UA: NEGATIVE
Nitrite, UA: NEGATIVE
Protein,UA: NEGATIVE
RBC, UA: NEGATIVE
Specific Gravity, UA: 1.019 (ref 1.005–1.030)
Urobilinogen, Ur: 1 mg/dL (ref 0.2–1.0)
pH, UA: 7 (ref 5.0–7.5)

## 2023-08-16 NOTE — Assessment & Plan Note (Signed)
New patient establishing care, never had a colonoscopy, HIV screening, or lipid profile in this gentleman with BMI near 30.  He does not take any medications at this time. Plan: Lipid panel A1c HIV Referral for colonoscopy

## 2023-08-16 NOTE — Assessment & Plan Note (Signed)
Patient presented to the ED with generalized abdominal pain, nausea, vomiting, and malaise on 07/24/2023.  He was subsequently found to have elevated LFTs found to be hep B positive.  He says that his sisters all died of liver disease, but unclear exactly why.  His condition improved and he was deemed stable for discharge.  He is scheduled to see GI on November 20, 2023. Workup unrevealing of why he had a bout of acute hepatitis.  Today he denies nausea, vomiting, abdominal pain, or constipation.  He endorses 1-2 bouts of diarrhea since he left the hospital.  He does endorse dark urine since leaving the hospital as well as well as continued fatigue and malaise.  He describes episodes of dizziness in the past, but denies them at this time.  Plan: Hepatitis B E antigen Hepatitis D IgG IgM Repeat LFTs PT/INR UA Referral to infectious disease, Dr. Ninetta Lights

## 2023-08-17 LAB — CMP14 + ANION GAP
ALT: 113 [IU]/L — ABNORMAL HIGH (ref 0–44)
AST: 52 [IU]/L — ABNORMAL HIGH (ref 0–40)
Albumin: 4.1 g/dL (ref 4.1–5.1)
Alkaline Phosphatase: 67 [IU]/L (ref 44–121)
Anion Gap: 17 mmol/L (ref 10.0–18.0)
BUN/Creatinine Ratio: 13 (ref 9–20)
BUN: 13 mg/dL (ref 6–24)
Bilirubin Total: 0.8 mg/dL (ref 0.0–1.2)
CO2: 20 mmol/L (ref 20–29)
Calcium: 9.1 mg/dL (ref 8.7–10.2)
Chloride: 104 mmol/L (ref 96–106)
Creatinine, Ser: 1.02 mg/dL (ref 0.76–1.27)
Globulin, Total: 3.6 g/dL (ref 1.5–4.5)
Glucose: 93 mg/dL (ref 70–99)
Potassium: 3.9 mmol/L (ref 3.5–5.2)
Sodium: 141 mmol/L (ref 134–144)
Total Protein: 7.7 g/dL (ref 6.0–8.5)
eGFR: 90 mL/min/{1.73_m2} (ref >59)

## 2023-08-17 LAB — LIPID PANEL
Chol/HDL Ratio: 4.6 {ratio} (ref 0.0–5.0)
Cholesterol, Total: 189 mg/dL (ref 100–199)
HDL: 41 mg/dL (ref >39)
LDL Chol Calc (NIH): 122 mg/dL — ABNORMAL HIGH (ref 0–99)
Triglycerides: 148 mg/dL (ref 0–149)
VLDL Cholesterol Cal: 26 mg/dL (ref 5–40)

## 2023-08-17 LAB — HDV ANTIBODY, IGG/IGM: HDV Antibody, IgG/IgM: NONREACTIVE

## 2023-08-17 LAB — HIV ANTIBODY (ROUTINE TESTING W REFLEX): HIV Screen 4th Generation wRfx: NONREACTIVE

## 2023-08-17 LAB — HEPATITIS B E ANTIBODY: Hep B E Ab: REACTIVE — AB

## 2023-08-17 LAB — HEMOGLOBIN A1C
Est. average glucose Bld gHb Est-mCnc: 91 mg/dL
Hgb A1c MFr Bld: 4.8 % (ref 4.8–5.6)

## 2023-08-17 LAB — INTERPRETATION:

## 2023-08-21 NOTE — Progress Notes (Signed)
Internal Medicine Clinic Attending  I was physically present during the key portions of the resident provided service and participated in the medical decision making of patient's management care. I reviewed pertinent patient test results.  The assessment, diagnosis, and plan were formulated together and I agree with the documentation in the resident's note.  Narendra, Nischal, MD  

## 2023-09-12 ENCOUNTER — Ambulatory Visit: Payer: No Typology Code available for payment source | Admitting: Infectious Diseases

## 2023-11-20 ENCOUNTER — Encounter: Payer: Self-pay | Admitting: Internal Medicine

## 2023-11-20 ENCOUNTER — Ambulatory Visit: Payer: No Typology Code available for payment source | Admitting: Internal Medicine

## 2023-11-20 VITALS — BP 124/78 | HR 77 | Ht 64.0 in | Wt 180.2 lb

## 2023-11-20 DIAGNOSIS — R7989 Other specified abnormal findings of blood chemistry: Secondary | ICD-10-CM | POA: Diagnosis not present

## 2023-11-20 DIAGNOSIS — B181 Chronic viral hepatitis B without delta-agent: Secondary | ICD-10-CM | POA: Diagnosis not present

## 2023-11-20 DIAGNOSIS — Z1211 Encounter for screening for malignant neoplasm of colon: Secondary | ICD-10-CM

## 2023-11-20 MED ORDER — NA SULFATE-K SULFATE-MG SULF 17.5-3.13-1.6 GM/177ML PO SOLN: ORAL | 0 refills | Status: DC

## 2023-11-20 MED ORDER — TENOFOVIR ALAFENAMIDE FUMARATE 25 MG PO TABS: 25.0000 mg | ORAL_TABLET | Freq: Every day | ORAL | 0 refills | Status: DC

## 2023-11-20 NOTE — Patient Instructions (Addendum)
.  Your provider has requested that you go to the basement level for lab work before leaving today. Press "B" on the elevator. The lab is located at the first door on the left as you exit the elevator.  You have been scheduled for an abdominal ultrasound at [facility] (1st floor of hospital) on 12/31/23 at 9am. Please arrive 30 minutes prior to your appointment for registration. Make certain not to have anything to eat or drink 6 hours prior to your appointment. Should you need to reschedule your appointment, please contact radiology at [phone]. This test typically takes about 30 minutes to perform.   You have been scheduled for a colonoscopy. Please follow written instructions given to you at your visit today.   Please pick up your prep supplies at the pharmacy within the next 1-3 days.  If you use inhalers (even only as needed), please bring them with you on the day of your procedure.  DO NOT TAKE 7 DAYS PRIOR TO TEST- Trulicity (dulaglutide) Ozempic, Wegovy (semaglutide) Mounjaro (tirzepatide) Bydureon Bcise (exanatide extended release)  DO NOT TAKE 1 DAY PRIOR TO YOUR TEST Rybelsus (semaglutide) Adlyxin (lixisenatide) Victoza (liraglutide) Byetta (exanatide) ___________________________________________________________________________   We have sent the following medications to your pharmacy for you to pick up at your convenience: Suprep, tenofovir alafenamide (TAF) 25 mg take daily   _______________________________________________________  If your blood pressure at your visit was 140/90 or greater, please contact your primary care physician to follow up on this.  _______________________________________________________  If you are age 63 or older, your body mass index should be between 23-30. Your Body mass index is 30.94 kg/m. If this is out of the aforementioned range listed, please consider follow up with your Primary Care Provider.  If you are age 35 or younger,  your body mass index should be between 19-25. Your Body mass index is 30.94 kg/m. If this is out of the aformentioned range listed, please consider follow up with your Primary Care Provider.   ________________________________________________________  The [facility] providers would like to encourage you to use [facility] to communicate with providers for non-urgent requests or questions.  Due to long hold times on the telephone, sending your provider a message by [facility] may be a faster and more efficient way to get a response.  Please allow 48 business hours for a response.  Please remember that this is for non-urgent requests.  _______________________________________________________

## 2023-11-20 NOTE — Progress Notes (Signed)
Chief Complaint: Hepatitis B  HPI : 53 year old male with history of chronic hepatitis B presents with hepatitis B  Interval History: He was hospitalized for jaundice and elevated LFTs in 07/2023, favoring an exacerbation of his chronic hepatitis B due to [facility] from recent naproxen use versus an infection.  His LFTs decreased and his abdominal pain improved.  Acute hepatitis B was ruled out.  He has never been on treatment for hepatitis B in the past. His mother and sister had hepatitis B. His sister died from hepatitis/liver cancer. Since his hospitalization, his skin has been less yellow.  Denies scleral icterus.  Denies dark urine.  Denies alcohol use. Denies ab pain. He felt very weak and fatigued in the hospital, which has improved. Denies fevers, swelling in abdomen or legs, confusion, or blood in the stools. Denies family history of colon cancer. Denies prior colonoscopy.   Past Medical History:  Diagnosis Date   Hepatitis B      History reviewed. No pertinent surgical history. Family History  Problem Relation Age of Onset   Hepatitis B Mother    Hepatitis B Sister    Social History   Tobacco Use   Smoking status: Never   Smokeless tobacco: Never  Substance Use Topics   Alcohol use: Never   Drug use: Never   Current Outpatient Medications  Medication Sig Dispense Refill   Na Sulfate-K Sulfate-Mg Sulfate concentrate 17.5-3.13-1.6 GM/177ML SOLN Use as directed; may use generic; goodrx card if insurance will not cover generic 354 mL 0   tenofovir alafenamide (VEMLIDY) 25 MG tablet Take 1 tablet (25 mg total) by mouth daily. 30 tablet 0   No current facility-administered medications for this visit.   No Known Allergies   Review of Systems: All systems reviewed and negative except where noted in HPI.   Physical Exam: BP 124/78 (BP Location: Left Arm, Patient Position: Sitting, Cuff Size: Normal)   Pulse 77   Ht 5\' 4"  (1.626 m)   Wt 180 lb 4 oz (81.8 kg)   SpO2 96%    BMI 30.94 kg/m  Constitutional: Pleasant,well-developed, male in no acute distress. HEENT: Normocephalic and atraumatic. Conjunctivae are normal. No scleral icterus. Cardiovascular: Normal rate, regular rhythm.  Pulmonary/chest: Effort normal and breath sounds normal. No wheezing, rales or rhonchi. Abdominal: Soft, nondistended, nontender. Bowel sounds active throughout. There are no masses palpable. No hepatomegaly. Extremities: No edema Neurological: Alert and oriented to person place and time. Skin: Skin is warm and dry. No rashes noted. Psychiatric: Normal mood and affect. Behavior is normal.  Labs 07/2023: CBC nml. Hepatitis B surface antigen reactive. Hepatitis B core antibody IgM was nonreactive, suggesting against acute infection. Hepatitis B DNA ultra quant was elevated at 552,000. HFE negative. IgG is mildly elevated 2075. ASMA and ANA are negative. Ceruloplasmin and alpha-1 antitrypsin are negative. AMA is negative.   Labs 07/2023: Hepatitis B E antibody reactive. INR 1.2.  HDV antibody negative.   CT A/P w/contrast 07/23/23: IMPRESSION: 1. No acute or active process within the abdomen or pelvis. 2. Mild prostatomegaly.  ASSESSMENT AND PLAN: Chronic hepatitis B Elevated LFTs Fatigue Colon cancer screening Patient was noted to have significantly elevated LFTs and elevated HBV DNA quant during his recent hospitalization.  His LFTs steadily decreased and his abdominal pain resolved during that hospitalization.  Will get some baseline hepatitis B labs today and start him on treatment for hepatitis B.  Patient will also need to be maintained on regular HCC screening.  Patient has  never had colon cancer screening.  I did discuss the risk and benefits of a colonoscopy procedure with the patient today including the risk of infection, bleeding, perforation, and changes in vital signs. He is agreeable to proceeding with colonoscopy for colon cancer screening - Check CBC, CMP, PT/INR, HBV  DNA, hepatitis B E antigen, hepatitis B E antibody - Check RUQ U/S in 12/2023 for HCC screening - Start tenofovir alafenamide (TAF) 25 mg daily - Colonoscopy [facility] - RTC in 3 months  Eulah Pont, MD  I spent 40 minutes of time, including in depth chart review, independent review of results as outlined above, communicating results with the patient directly, face-to-face time with the patient, coordinating care, ordering studies and medications as appropriate, and documentation.

## 2023-11-21 ENCOUNTER — Other Ambulatory Visit (HOSPITAL_COMMUNITY): Payer: Self-pay

## 2023-11-21 ENCOUNTER — Telehealth: Payer: Self-pay | Admitting: Pharmacy Technician

## 2023-11-21 NOTE — Telephone Encounter (Signed)
Pharmacy Patient Advocate Encounter   Received notification from Patient Pharmacy that prior authorization for [facility] 25MG  is required/requested.   Insurance verification completed.   The patient is insured through [facility] .   Per test claim: PA required; PA submitted to above mentioned insurance via CoverMyMeds Key/confirmation #/[id number] Status is pending

## 2023-11-23 NOTE — Telephone Encounter (Signed)
Pharmacy Patient Advocate Encounter  Received notification from [facility] that Prior Authorization for Vemlidy 25MG  tablets has been DENIED.  Full denial letter will be uploaded to the media tab. See denial reason below.  Per your health plan's criteria, this drug is covered if you meet the following:  (1) One of the following:  (I) Paid claims or your doctor provides your medical records showing that you have tried or cannot use generic entecavir. (Ii) Paid claims or your doctor provides your medical records showing both of the following:  (A) You are currently on Viread. (B) One of the following: (I) You have kidney problems (renal impairment) (Ii) You have brittle bones (osteoporosis) (20 Paid claims or your doctor provides your medical records showing that you are continuing the therapy (defined as no more than a 45-day gap in therapy)  PA #/Case ID/Reference #: [id number]

## 2023-11-26 ENCOUNTER — Other Ambulatory Visit: Payer: Self-pay

## 2023-11-26 MED ORDER — OYSTER SHELL CALCIUM/D3 500-5 MG-MCG PO TABS: 1.0000 | ORAL_TABLET | Freq: Every day | ORAL | 6 refills | Status: AC

## 2023-11-26 MED ORDER — TENOFOVIR DISOPROXIL FUMARATE 150 MG PO TABS: 300.0000 mg | ORAL_TABLET | Freq: Every day | ORAL | 0 refills | Status: DC

## 2023-11-26 NOTE — Telephone Encounter (Signed)
Okay to switch from TAF to TDF (tenofovir disoproxil fumarate) 300 mg every day. This medication does have more potential side effects than TAF such as decreased bone density and decreased kidney function, but we will monitor this while he is on the medication. He can take calcium and vitamin D supplements to try to reduce his risk of decreased bone density.

## 2023-11-27 ENCOUNTER — Other Ambulatory Visit: Payer: Self-pay

## 2023-11-27 ENCOUNTER — Other Ambulatory Visit (INDEPENDENT_AMBULATORY_CARE_PROVIDER_SITE_OTHER): Payer: No Typology Code available for payment source

## 2023-11-27 DIAGNOSIS — B181 Chronic viral hepatitis B without delta-agent: Secondary | ICD-10-CM | POA: Diagnosis not present

## 2023-11-27 DIAGNOSIS — R7989 Other specified abnormal findings of blood chemistry: Secondary | ICD-10-CM

## 2023-11-27 DIAGNOSIS — Z1211 Encounter for screening for malignant neoplasm of colon: Secondary | ICD-10-CM

## 2023-11-27 LAB — CBC WITH DIFFERENTIAL/PLATELET
Basophils Absolute: 0 10*3/uL (ref 0.0–0.1)
Basophils Relative: 0.4 % (ref 0.0–3.0)
Eosinophils Absolute: 0 10*3/uL (ref 0.0–0.7)
Eosinophils Relative: 0.9 % (ref 0.0–5.0)
HCT: 47.4 % (ref 39.0–52.0)
Hemoglobin: 15.8 g/dL (ref 13.0–17.0)
Lymphocytes Relative: 31.9 % (ref 12.0–46.0)
Lymphs Abs: 1.7 10*3/uL (ref 0.7–4.0)
MCHC: 33.3 g/dL (ref 30.0–36.0)
MCV: 88.9 fL (ref 78.0–100.0)
Monocytes Absolute: 0.6 10*3/uL (ref 0.1–1.0)
Monocytes Relative: 10.5 % (ref 3.0–12.0)
Neutro Abs: 3 10*3/uL (ref 1.4–7.7)
Neutrophils Relative %: 56.3 % (ref 43.0–77.0)
Platelets: 194 10*3/uL (ref 150.0–400.0)
RBC: 5.34 Mil/uL (ref 4.22–5.81)
RDW: 13 % (ref 11.5–15.5)
WBC: 5.4 10*3/uL (ref 4.0–10.5)

## 2023-11-27 LAB — COMPREHENSIVE METABOLIC PANEL
ALT: 126 U/L — ABNORMAL HIGH (ref 0–53)
AST: 54 U/L — ABNORMAL HIGH (ref 0–37)
Albumin: 4 g/dL (ref 3.5–5.2)
Alkaline Phosphatase: 65 U/L (ref 39–117)
BUN: 20 mg/dL (ref 6–23)
CO2: 27 meq/L (ref 19–32)
Calcium: 8.8 mg/dL (ref 8.4–10.5)
Chloride: 106 meq/L (ref 96–112)
Creatinine, Ser: 0.87 mg/dL (ref 0.40–1.50)
GFR: 100.21 mL/min (ref >60.00)
Glucose, Bld: 137 mg/dL — ABNORMAL HIGH (ref 70–99)
Potassium: 3.5 meq/L (ref 3.5–5.1)
Sodium: 138 meq/L (ref 135–145)
Total Bilirubin: 0.8 mg/dL (ref 0.2–1.2)
Total Protein: 7.4 g/dL (ref 6.0–8.3)

## 2023-11-27 LAB — PROTIME-INR
INR: 1.4 {ratio} — ABNORMAL HIGH (ref 0.8–1.0)
Prothrombin Time: 14.3 s — ABNORMAL HIGH (ref 9.6–13.1)

## 2023-11-28 ENCOUNTER — Other Ambulatory Visit: Payer: Self-pay

## 2023-11-28 ENCOUNTER — Other Ambulatory Visit (HOSPITAL_COMMUNITY): Payer: Self-pay

## 2023-11-28 MED ORDER — TENOFOVIR DISOPROXIL FUMARATE 150 MG PO TABS: 300.0000 mg | ORAL_TABLET | Freq: Every day | ORAL | 0 refills | Status: DC

## 2023-11-30 LAB — HEPATITIS B DNA, ULTRAQUANTITATIVE, PCR
Hepatitis B DNA: 8350000 [IU]/mL — ABNORMAL HIGH
Hepatitis B virus DNA: 6.92 {Log} — ABNORMAL HIGH

## 2023-11-30 LAB — HEPATITIS B E ANTIBODY: Hep B E Ab: REACTIVE — AB

## 2023-11-30 LAB — HEPATITIS B E ANTIGEN: Hep B E Ag: NONREACTIVE

## 2023-12-25 ENCOUNTER — Encounter: Payer: PRIVATE HEALTH INSURANCE | Admitting: Internal Medicine

## 2023-12-31 ENCOUNTER — Ambulatory Visit (HOSPITAL_COMMUNITY): Payer: No Typology Code available for payment source

## 2024-01-14 ENCOUNTER — Telehealth: Payer: Self-pay | Admitting: Internal Medicine

## 2024-01-14 NOTE — Telephone Encounter (Signed)
 Good afternoon Dr. Leonides Schanz I received a call from this patient daughter to reschedule her fathers  colonoscopy procedure due to her father getting dentures. Patient was rescheduled for April the 30 th at 3 PM. Please advise.

## 2024-01-15 ENCOUNTER — Encounter: Payer: PRIVATE HEALTH INSURANCE | Admitting: Internal Medicine

## 2024-02-07 ENCOUNTER — Telehealth: Payer: Self-pay

## 2024-02-07 ENCOUNTER — Encounter

## 2024-02-07 NOTE — Telephone Encounter (Signed)
 Pt no show for PV apt. Spoke with pt and his daughter to r/s. Requested procedures be cancelled for now they will call back to r/s.

## 2024-02-13 ENCOUNTER — Ambulatory Visit

## 2024-02-13 VITALS — Ht 65.0 in | Wt 188.0 lb

## 2024-02-13 DIAGNOSIS — B181 Chronic viral hepatitis B without delta-agent: Secondary | ICD-10-CM

## 2024-02-13 DIAGNOSIS — Z1211 Encounter for screening for malignant neoplasm of colon: Secondary | ICD-10-CM

## 2024-02-13 NOTE — Progress Notes (Signed)
 No egg or soy allergy known to patient  No issues known to pt with past sedation with any surgeries or procedures Patient denies ever being told they had issues or difficulty with intubation  Pt is not on diet pills Pt is not on  home 02  Pt is not on blood thinners  Pt denies issues with chronic constipation  No A fib or A flutter Have any cardiac testing pending--no Pt instructed to use Singlecare.com or GoodRx for a price reduction on prep  Ambulates independently Patient has very limited knowledge of family history

## 2024-02-19 ENCOUNTER — Encounter: Payer: Self-pay | Admitting: Internal Medicine

## 2024-02-20 ENCOUNTER — Encounter: Payer: Self-pay | Admitting: Internal Medicine

## 2024-02-20 ENCOUNTER — Ambulatory Visit (AMBULATORY_SURGERY_CENTER): Admitting: Internal Medicine

## 2024-02-20 VITALS — BP 110/81 | HR 66 | Temp 98.1°F | Resp 14 | Ht 65.0 in | Wt 188.0 lb

## 2024-02-20 DIAGNOSIS — K648 Other hemorrhoids: Secondary | ICD-10-CM | POA: Diagnosis not present

## 2024-02-20 DIAGNOSIS — D123 Benign neoplasm of transverse colon: Secondary | ICD-10-CM

## 2024-02-20 DIAGNOSIS — B181 Chronic viral hepatitis B without delta-agent: Secondary | ICD-10-CM

## 2024-02-20 DIAGNOSIS — D122 Benign neoplasm of ascending colon: Secondary | ICD-10-CM

## 2024-02-20 DIAGNOSIS — Z1211 Encounter for screening for malignant neoplasm of colon: Secondary | ICD-10-CM

## 2024-02-20 DIAGNOSIS — K635 Polyp of colon: Secondary | ICD-10-CM | POA: Diagnosis not present

## 2024-02-20 MED ORDER — SODIUM CHLORIDE 0.9 % IV SOLN
500.0000 mL | INTRAVENOUS | Status: AC
Start: 2024-02-20 — End: 2024-02-21

## 2024-02-20 NOTE — Progress Notes (Signed)
 Called to room to assist during endoscopic procedure.  Patient ID and intended procedure confirmed with present staff. Received instructions for my participation in the procedure from the performing physician.

## 2024-02-20 NOTE — Patient Instructions (Signed)
 Resume regular diet Resume regular medications See handouts for hemorrhoids and polyps Await pathology results  YOU HAD AN ENDOSCOPIC PROCEDURE TODAY AT THE [facility]:   Refer to the procedure report that was given to you for any specific questions about what was found during the examination.  If the procedure report does not answer your questions, please call your gastroenterologist to clarify.  If you requested that your care partner not be given the details of your procedure findings, then the procedure report has been included in a sealed envelope for you to review at your convenience later.  YOU SHOULD EXPECT: Some feelings of bloating in the abdomen. Passage of more gas than usual.  Walking can help get rid of the air that was put into your GI tract during the procedure and reduce the bloating. If you had a lower endoscopy (such as a colonoscopy or flexible sigmoidoscopy) you may notice spotting of blood in your stool or on the toilet paper. If you underwent a bowel prep for your procedure, you may not have a normal bowel movement for a few days.  Please Note:  You might notice some irritation and congestion in your nose or some drainage.  This is from the oxygen used during your procedure.  There is no need for concern and it should clear up in a day or so.  SYMPTOMS TO REPORT IMMEDIATELY:  Following lower endoscopy (colonoscopy or flexible sigmoidoscopy):  Excessive amounts of blood in the stool  Significant tenderness or worsening of abdominal pains  Swelling of the abdomen that is new, acute  Fever of 100F or higher  For urgent or emergent issues, a gastroenterologist can be reached at any hour by calling [phone]. Do not use MyChart messaging for urgent concerns.    DIET:  We do recommend a small meal at first, but then you may proceed to your regular diet.  Drink plenty of fluids but you should avoid alcoholic beverages for 24 hours.  ACTIVITY:  You should  plan to take it easy for the rest of today and you should NOT DRIVE or use heavy machinery until tomorrow (because of the sedation medicines used during the test).    FOLLOW UP: Our staff will call the number listed on your records the next business day following your procedure.  We will call around 7:15- 8:00 am to check on you and address any questions or concerns that you may have regarding the information given to you following your procedure. If we do not reach you, we will leave a message.     If any biopsies were taken you will be contacted by phone or by letter within the next 1-3 weeks.  Please call us  at [phone] if you have not heard about the biopsies in 3 weeks.    SIGNATURES/CONFIDENTIALITY: You and/or your care partner have signed paperwork which will be entered into your electronic medical record.  These signatures attest to the fact that that the information above on your After Visit Summary has been reviewed and is understood.  Full responsibility of the confidentiality of this discharge information lies with you and/or your care-partner.

## 2024-02-20 NOTE — Progress Notes (Signed)
 A/o x 3, VSS, gd SR's, pleased with anesthesia, report to RN

## 2024-02-20 NOTE — Op Note (Addendum)
 [facility] Patient Name: Cesar Reyes Procedure Date: 02/20/2024 8:42 AM MRN: [id number] Endoscopist: Freada Jacobs [place] , , [id number] Age: 53 Referring MD:  Date of Birth: 04/21/1971 Gender: Male Account #: [id number] Procedure:                Colonoscopy Indications:              Screening for colorectal malignant neoplasm, This                            is the patient's first colonoscopy Medicines:                Monitored Anesthesia Care Procedure:                Pre-Anesthesia Assessment:                           - Prior to the procedure, a History and Physical                            was performed, and patient medications and                            allergies were reviewed. The patient's tolerance of                            previous anesthesia was also reviewed. The risks                            and benefits of the procedure and the sedation                            options and risks were discussed with the patient.                            All questions were answered, and informed consent                            was obtained. Prior Anticoagulants: The patient has                            taken no anticoagulant or antiplatelet agents. ASA                            Grade Assessment: II - A patient with mild systemic                            disease. After reviewing the risks and benefits,                            the patient was deemed in satisfactory condition to                            undergo the procedure.  After obtaining informed consent, the colonoscope                            was passed under direct vision. Throughout the                            procedure, the patient's blood pressure, pulse, and                            oxygen saturations were monitored continuously. The                            CF [id number] #[id number] was introduced through the anus                            and advanced to the the  terminal ileum. The                            colonoscopy was performed without difficulty. The                            patient tolerated the procedure well. The quality                            of the bowel preparation was excellent. The                            terminal ileum, ileocecal valve, appendiceal                            orifice, and rectum were photographed. Scope In: 8:46:39 AM Scope Out: 9:02:29 AM Scope Withdrawal Time: 0 hours 13 minutes 30 seconds  Total Procedure Duration: 0 hours 15 minutes 50 seconds  Findings:                 The terminal ileum appeared normal.                           Two sessile polyps were found in the transverse                            colon and ascending colon. The polyps were 3 to 6                            mm in size. These polyps were removed with a cold                            snare. Resection and retrieval were complete.                           Non-bleeding internal hemorrhoids were found during                            retroflexion. Complications:  No immediate complications. Estimated Blood Loss:     Estimated blood loss was minimal. Impression:               - The examined portion of the ileum was normal.                           - Two 3 to 6 mm polyps in the transverse colon and                            in the ascending colon, removed with a cold snare.                            Resected and retrieved.                           - Non-bleeding internal hemorrhoids. Recommendation:           - Discharge patient to home (with escort).                           - Await pathology results.                           - We will look into why he never got his TDF                            medication and calcium /vitamin D supplement                           - Will arrange for follow up in clinic in 3 months                            for hepatitis B.                           - The findings and  recommendations were discussed                            with the patient. Dr Pedro Bourgeois "Anastacio Balm" Rosaline Coma,  02/20/2024 9:11:37 AM

## 2024-02-20 NOTE — Progress Notes (Signed)
 GASTROENTEROLOGY PROCEDURE H&P NOTE   Primary Care Physician: Sheree Dieter, MD    Reason for Procedure:   Colon cancer screening  Plan:    Colonoscopy  Patient is appropriate for endoscopic procedure(s) in the ambulatory ([facility]) setting.  The nature of the procedure, as well as the risks, benefits, and alternatives were carefully and thoroughly reviewed with the patient. Ample time for discussion and questions allowed. The patient understood, was satisfied, and agreed to proceed.     HPI: Cesar Reyes is a 53 y.o. male who presents for colonoscopy for evaluation of colon cancer screening.  Patient was most recently seen in the Gastroenterology Clinic on 11/20/23.  No interval change in medical history since that appointment. Please refer to that note for full details regarding GI history and clinical presentation.   Past Medical History:  Diagnosis Date   Allergy    Hepatitis B     History reviewed. No pertinent surgical history.  Prior to Admission medications   Medication Sig Start Date End Date Taking? Authorizing Provider  calcium -vitamin D (OSCAL WITH D) 500-5 MG-MCG tablet Take 1 tablet by mouth daily with breakfast. Patient not taking: Reported on 02/20/2024 11/26/23   Daina Drum, MD  Tenofovir  Disoproxil Fumarate 150 MG TABS Take 300 mg by mouth daily. Patient not taking: Reported on 02/20/2024 11/28/23   Daina Drum, MD    Current Outpatient Medications  Medication Sig Dispense Refill   calcium -vitamin D (OSCAL WITH D) 500-5 MG-MCG tablet Take 1 tablet by mouth daily with breakfast. (Patient not taking: Reported on 02/20/2024) 30 tablet 6   Tenofovir  Disoproxil Fumarate 150 MG TABS Take 300 mg by mouth daily. (Patient not taking: Reported on 02/20/2024) 60 tablet 0   Current Facility-Administered Medications  Medication Dose Route Frequency Provider Last Rate Last Admin   0.9 %  sodium chloride  infusion  500 mL Intravenous Continuous Daina Drum, MD         Allergies as of 02/20/2024   (No Known Allergies)    Family History  Problem Relation Age of Onset   Hepatitis B Mother    Hepatitis B Sister    Colon cancer Neg Hx    Esophageal cancer Neg Hx    Rectal cancer Neg Hx    Stomach cancer Neg Hx    Colon polyps Neg Hx     Social History   Socioeconomic History   Marital status: Married    Spouse name: Not on file   Number of children: Not on file   Years of education: Not on file   Highest education level: Not on file  Occupational History   Not on file  Tobacco Use   Smoking status: Never   Smokeless tobacco: Never  Vaping Use   Vaping status: Never Used  Substance and Sexual Activity   Alcohol use: Never   Drug use: Never   Sexual activity: Never  Other Topics Concern   Not on file  Social History Narrative   Not on file   Social Drivers of Health   Financial Resource Strain: Not on File (03/27/2023)   Received from [employer]    Financial Resource Strain: 0  Food Insecurity: Not on File (07/26/2023)   Received from [facility] Insecurity    Food: 0  Transportation Needs: No Transportation Needs (07/24/2023)   PRAPARE - Administrator, Civil Service (Medical): No    Lack of Transportation (Non-Medical): No  Physical Activity: Not on File (03/27/2023)   Received from [facility]   Physical Activity    Physical Activity: 0  Stress: Not on File (03/27/2023)   Received from [facility]   Stress    Stress: 0  Social Connections: Not on File (07/16/2023)   Received from [facility]   Social Connections    Connectedness: 0  Intimate Partner Violence: Not At Risk (07/24/2023)   Humiliation, Afraid, Rape, and Kick questionnaire    Fear of Current or Ex-Partner: No    Emotionally Abused: No    Physically Abused: No    Sexually Abused: No    Physical Exam: Vital signs in last 24 hours: BP 116/83   Pulse 61   Temp 98.1 F (36.7 C)   Resp (!) 98   Ht 5\' 5"  (1.651 m)   Wt 188 lb (85.3  kg)   SpO2 94%   BMI 31.28 kg/m  GEN: NAD EYE: Sclerae anicteric ENT: MMM CV: Non-tachycardic Pulm: No increased WOB GI: Soft NEURO:  Alert & Oriented   Regino Caprio, MD  Gastroenterology   02/20/2024 8:32 AM

## 2024-02-20 NOTE — Progress Notes (Signed)
 VS by DT  Pt's states no medical or surgical changes since previsit or office visit.   No interpreter available for patient. Patient and patients daughter agreed to have daughter interpret

## 2024-02-21 ENCOUNTER — Telehealth: Payer: Self-pay

## 2024-02-21 NOTE — Telephone Encounter (Signed)
 Unable to reach patient via interpreter, left message for patient to call back. Called daughter that is listed under contacts, but she is not the daughter that came with patient to procedure. She is going to try & reach pt and find out his other daughter's contact number, and then give me a call back.

## 2024-02-21 NOTE — Telephone Encounter (Signed)
 Attempted f/u call. Interpreter unavailable at [employer]

## 2024-02-21 NOTE — Telephone Encounter (Signed)
-----   Message from Daina Drum sent at 02/20/2024  9:18 AM EDT ----- Hi Pod B triage, I saw this patient for his procedure today, and his daughter told me that he did not ever get his TDF (tenofovir  disoproxil fumarate) 300 mg every day or calcium /vitamin D supplements.  He was not approved for TAF so that is why we switched him over to TDF. Could we look into why he never got his medications? His daughter speaks English so that is the easiest way to probably get in touch with him. Let's also schedule a follow up appt in 3 months for his hepatitis B.

## 2024-02-22 ENCOUNTER — Encounter: Payer: Self-pay | Admitting: Internal Medicine

## 2024-02-22 ENCOUNTER — Other Ambulatory Visit: Payer: Self-pay

## 2024-02-22 ENCOUNTER — Other Ambulatory Visit (HOSPITAL_COMMUNITY): Payer: Self-pay

## 2024-02-22 LAB — SURGICAL PATHOLOGY

## 2024-02-22 MED ORDER — TENOFOVIR DISOPROXIL FUMARATE 150 MG PO TABS: 300.0000 mg | ORAL_TABLET | Freq: Every day | ORAL | 0 refills | Status: DC

## 2024-02-22 NOTE — Telephone Encounter (Signed)
 Unable to reach patient via interpreter, left message for patient to call back.

## 2024-02-22 NOTE — Telephone Encounter (Signed)
 Spoke to patient's daughter Cesar Reyes [phone] & she prefers to be the patient's main contact. Chart updated. Both her & the patient as unsure why they weren't able to pick up prescription from January. New script sent to pharmacy & advised daughter to call back if there are any issues. OV scheduled for 05/23/24 at 3:20 pm with Dr. Rosaline Coma.

## 2024-02-27 ENCOUNTER — Encounter: Payer: PRIVATE HEALTH INSURANCE | Admitting: Internal Medicine

## 2024-04-01 ENCOUNTER — Other Ambulatory Visit: Payer: Self-pay | Admitting: Internal Medicine

## 2024-04-14 ENCOUNTER — Encounter: Payer: Self-pay | Admitting: *Deleted

## 2024-05-23 ENCOUNTER — Ambulatory Visit: Admitting: Internal Medicine
